# Patient Record
Sex: Female | Born: 1985 | Race: Black or African American | Hispanic: Yes | Smoking: Never smoker
Health system: Southern US, Community
[De-identification: ages and names within clinical notes are randomized; demographics above are authoritative.]

## PROBLEM LIST (undated history)

## (undated) DIAGNOSIS — R51 Headache: Secondary | ICD-10-CM

## (undated) DIAGNOSIS — R519 Headache, unspecified: Secondary | ICD-10-CM

## (undated) DIAGNOSIS — R001 Bradycardia, unspecified: Secondary | ICD-10-CM

## (undated) DIAGNOSIS — M797 Fibromyalgia: Secondary | ICD-10-CM

## (undated) DIAGNOSIS — E559 Vitamin D deficiency, unspecified: Secondary | ICD-10-CM

## (undated) DIAGNOSIS — M3500 Sicca syndrome, unspecified: Secondary | ICD-10-CM

## (undated) HISTORY — DX: Sjogren syndrome, unspecified: M35.00

## (undated) HISTORY — PX: TUBAL LIGATION: SHX77

## (undated) HISTORY — PX: NECK SURGERY: SHX720

---
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
## 2004-04-05 ENCOUNTER — Inpatient Hospital Stay (HOSPITAL_COMMUNITY): Admission: AD | Admit: 2004-04-05 | Discharge: 2004-04-05 | Payer: Self-pay | Admitting: Obstetrics

## 2004-04-07 ENCOUNTER — Observation Stay (HOSPITAL_COMMUNITY): Admission: AD | Admit: 2004-04-07 | Discharge: 2004-04-07 | Payer: Self-pay | Admitting: Obstetrics & Gynecology

## 2004-04-08 ENCOUNTER — Inpatient Hospital Stay (HOSPITAL_COMMUNITY): Admission: AD | Admit: 2004-04-08 | Discharge: 2004-04-10 | Payer: Self-pay | Admitting: Obstetrics & Gynecology

## 2004-05-02 ENCOUNTER — Emergency Department (HOSPITAL_COMMUNITY): Admission: EM | Admit: 2004-05-02 | Discharge: 2004-05-02 | Payer: Self-pay | Admitting: Emergency Medicine

## 2004-09-15 ENCOUNTER — Ambulatory Visit (HOSPITAL_COMMUNITY): Admission: RE | Admit: 2004-09-15 | Discharge: 2004-09-15 | Payer: Self-pay | Admitting: Obstetrics & Gynecology

## 2004-09-21 ENCOUNTER — Inpatient Hospital Stay (HOSPITAL_COMMUNITY): Admission: AD | Admit: 2004-09-21 | Discharge: 2004-09-22 | Payer: Self-pay | Admitting: Obstetrics

## 2004-10-03 ENCOUNTER — Ambulatory Visit (HOSPITAL_COMMUNITY): Admission: RE | Admit: 2004-10-03 | Discharge: 2004-10-03 | Payer: Self-pay | Admitting: Obstetrics & Gynecology

## 2007-09-27 ENCOUNTER — Emergency Department (HOSPITAL_COMMUNITY): Admission: EM | Admit: 2007-09-27 | Discharge: 2007-09-27 | Payer: Self-pay | Admitting: Emergency Medicine

## 2007-12-14 ENCOUNTER — Inpatient Hospital Stay (HOSPITAL_COMMUNITY): Admission: AD | Admit: 2007-12-14 | Discharge: 2007-12-14 | Payer: Self-pay | Admitting: Gynecology

## 2007-12-18 ENCOUNTER — Inpatient Hospital Stay (HOSPITAL_COMMUNITY): Admission: AD | Admit: 2007-12-18 | Discharge: 2007-12-18 | Payer: Self-pay | Admitting: Family Medicine

## 2008-10-24 ENCOUNTER — Ambulatory Visit (HOSPITAL_COMMUNITY): Admission: RE | Admit: 2008-10-24 | Discharge: 2008-10-24 | Payer: Self-pay | Admitting: Obstetrics and Gynecology

## 2008-11-20 ENCOUNTER — Ambulatory Visit (HOSPITAL_COMMUNITY): Admission: RE | Admit: 2008-11-20 | Discharge: 2008-11-20 | Payer: Self-pay | Admitting: Obstetrics and Gynecology

## 2008-12-11 ENCOUNTER — Ambulatory Visit (HOSPITAL_COMMUNITY): Admission: RE | Admit: 2008-12-11 | Discharge: 2008-12-11 | Payer: Self-pay | Admitting: Obstetrics and Gynecology

## 2008-12-23 ENCOUNTER — Inpatient Hospital Stay (HOSPITAL_COMMUNITY): Admission: AD | Admit: 2008-12-23 | Discharge: 2008-12-23 | Payer: Self-pay | Admitting: Obstetrics and Gynecology

## 2008-12-23 ENCOUNTER — Ambulatory Visit: Payer: Self-pay | Admitting: Advanced Practice Midwife

## 2009-05-06 ENCOUNTER — Inpatient Hospital Stay (HOSPITAL_COMMUNITY): Admission: AD | Admit: 2009-05-06 | Discharge: 2009-05-09 | Payer: Self-pay | Admitting: Obstetrics and Gynecology

## 2009-05-06 ENCOUNTER — Inpatient Hospital Stay (HOSPITAL_COMMUNITY): Admission: AD | Admit: 2009-05-06 | Discharge: 2009-05-06 | Payer: Self-pay | Admitting: Obstetrics & Gynecology

## 2009-05-13 ENCOUNTER — Inpatient Hospital Stay (HOSPITAL_COMMUNITY): Admission: AD | Admit: 2009-05-13 | Discharge: 2009-05-13 | Payer: Self-pay | Admitting: Obstetrics and Gynecology

## 2009-09-30 ENCOUNTER — Emergency Department (HOSPITAL_COMMUNITY): Admission: EM | Admit: 2009-09-30 | Discharge: 2009-09-30 | Payer: Self-pay | Admitting: Emergency Medicine

## 2010-04-11 ENCOUNTER — Ambulatory Visit (HOSPITAL_COMMUNITY)
Admission: RE | Admit: 2010-04-11 | Discharge: 2010-04-11 | Payer: Self-pay | Source: Home / Self Care | Attending: Obstetrics and Gynecology | Admitting: Obstetrics and Gynecology

## 2010-04-14 LAB — PREGNANCY, URINE: Preg Test, Ur: NEGATIVE

## 2010-06-15 LAB — URINALYSIS, ROUTINE W REFLEX MICROSCOPIC
Bilirubin Urine: NEGATIVE
Glucose, UA: NEGATIVE mg/dL
Hgb urine dipstick: NEGATIVE
Ketones, ur: NEGATIVE mg/dL
Nitrite: NEGATIVE
Protein, ur: NEGATIVE mg/dL
Specific Gravity, Urine: 1.029 (ref 1.005–1.030)
Urobilinogen, UA: 0.2 mg/dL (ref 0.0–1.0)
pH: 5.5 (ref 5.0–8.0)

## 2010-06-15 LAB — POCT PREGNANCY, URINE: Preg Test, Ur: NEGATIVE

## 2010-06-18 LAB — URINALYSIS, ROUTINE W REFLEX MICROSCOPIC
Bilirubin Urine: NEGATIVE
Glucose, UA: NEGATIVE mg/dL
Ketones, ur: NEGATIVE mg/dL
Nitrite: NEGATIVE
Protein, ur: 30 mg/dL — AB
Specific Gravity, Urine: 1.02 (ref 1.005–1.030)
Urobilinogen, UA: 0.2 mg/dL (ref 0.0–1.0)
pH: 6 (ref 5.0–8.0)

## 2010-06-18 LAB — CBC
HCT: 28.6 % — ABNORMAL LOW (ref 36.0–46.0)
HCT: 35.2 % — ABNORMAL LOW (ref 36.0–46.0)
Hemoglobin: 11.2 g/dL — ABNORMAL LOW (ref 12.0–15.0)
Hemoglobin: 9.3 g/dL — ABNORMAL LOW (ref 12.0–15.0)
MCHC: 31.9 g/dL (ref 30.0–36.0)
MCHC: 32.5 g/dL (ref 30.0–36.0)
MCV: 73.1 fL — ABNORMAL LOW (ref 78.0–100.0)
MCV: 74.1 fL — ABNORMAL LOW (ref 78.0–100.0)
Platelets: 177 10*3/uL (ref 150–400)
Platelets: 215 10*3/uL (ref 150–400)
RBC: 3.86 MIL/uL — ABNORMAL LOW (ref 3.87–5.11)
RBC: 4.81 MIL/uL (ref 3.87–5.11)
RDW: 17 % — ABNORMAL HIGH (ref 11.5–15.5)
RDW: 17.2 % — ABNORMAL HIGH (ref 11.5–15.5)
WBC: 10.5 10*3/uL (ref 4.0–10.5)
WBC: 20.2 10*3/uL — ABNORMAL HIGH (ref 4.0–10.5)

## 2010-06-18 LAB — URINE CULTURE: Colony Count: >=100000

## 2010-06-18 LAB — URINE MICROSCOPIC-ADD ON

## 2010-06-18 LAB — RPR: RPR Ser Ql: NONREACTIVE

## 2010-07-04 LAB — URINALYSIS, ROUTINE W REFLEX MICROSCOPIC
Bilirubin Urine: NEGATIVE
Glucose, UA: NEGATIVE mg/dL
Hgb urine dipstick: NEGATIVE
Ketones, ur: 40 mg/dL — AB
Nitrite: NEGATIVE
Protein, ur: NEGATIVE mg/dL
Specific Gravity, Urine: 1.02 (ref 1.005–1.030)
Urobilinogen, UA: 0.2 mg/dL (ref 0.0–1.0)
pH: 6.5 (ref 5.0–8.0)

## 2010-12-25 LAB — WET PREP, GENITAL
Trich, Wet Prep: NONE SEEN
WBC, Wet Prep HPF POC: NONE SEEN
Yeast Wet Prep HPF POC: NONE SEEN

## 2010-12-25 LAB — CBC
HCT: 40.2
Hemoglobin: 13.5
MCHC: 33.5
MCV: 75.7 — ABNORMAL LOW
Platelets: 275
RBC: 5.31 — ABNORMAL HIGH
RDW: 14.7
WBC: 15.3 — ABNORMAL HIGH

## 2010-12-25 LAB — DIFFERENTIAL
Basophils Relative: 0
Eosinophils Absolute: 1.5 — ABNORMAL HIGH
Eosinophils Relative: 10 — ABNORMAL HIGH
Lymphs Abs: 2.2
Monocytes Relative: 6

## 2010-12-25 LAB — PREGNANCY, URINE: Preg Test, Ur: NEGATIVE

## 2010-12-29 LAB — CBC
HCT: 37.3
Hemoglobin: 12.3
MCHC: 33
MCV: 78.2
Platelets: 283
RBC: 4.78
WBC: 10.4

## 2010-12-29 LAB — GC/CHLAMYDIA PROBE AMP, GENITAL: GC Probe Amp, Genital: NEGATIVE

## 2010-12-29 LAB — HCG, QUANTITATIVE, PREGNANCY: hCG, Beta Chain, Quant, S: 2833 — ABNORMAL HIGH

## 2010-12-29 LAB — HCG, SERUM, QUALITATIVE: Preg, Serum: POSITIVE — AB

## 2011-04-08 ENCOUNTER — Encounter (HOSPITAL_COMMUNITY): Payer: Self-pay | Admitting: Cardiology

## 2011-04-08 ENCOUNTER — Emergency Department (INDEPENDENT_AMBULATORY_CARE_PROVIDER_SITE_OTHER)
Admission: EM | Admit: 2011-04-08 | Discharge: 2011-04-08 | Disposition: A | Discharge status: Home / Self Care | Payer: BC Managed Care – PPO | Source: Home / Self Care | Attending: Emergency Medicine | Admitting: Emergency Medicine

## 2011-04-08 DIAGNOSIS — G43909 Migraine, unspecified, not intractable, without status migrainosus: Secondary | ICD-10-CM

## 2011-04-08 DIAGNOSIS — F410 Panic disorder [episodic paroxysmal anxiety] without agoraphobia: Secondary | ICD-10-CM

## 2011-04-08 DIAGNOSIS — G44209 Tension-type headache, unspecified, not intractable: Secondary | ICD-10-CM

## 2011-04-08 MED ORDER — KETOROLAC TROMETHAMINE 60 MG/2ML IM SOLN
60.0000 mg | Freq: Once | INTRAMUSCULAR | Status: AC
Start: 2011-04-08 — End: 2011-04-08
  Administered 2011-04-08: 60 mg via INTRAMUSCULAR

## 2011-04-08 MED ORDER — KETOROLAC TROMETHAMINE 60 MG/2ML IM SOLN
INTRAMUSCULAR | Status: AC
  Filled 2011-04-08: qty 2

## 2011-04-08 MED ORDER — KETOROLAC TROMETHAMINE 10 MG PO TABS: 10.0000 mg | ORAL_TABLET | Freq: Four times a day (QID) | ORAL | Status: AC | PRN

## 2011-04-08 MED ORDER — LORAZEPAM 1 MG PO TABS: 1.0000 mg | ORAL_TABLET | Freq: Three times a day (TID) | ORAL | Status: AC | PRN

## 2011-04-08 NOTE — ED Notes (Signed)
Pt started having a headache and dizziness at approx 1 pm today.  Pt also reports panic attack, fingers numb feeling like going to pass out bout 3pm. Told by nurse at job she needed to see a doctor. Denies N/V. Denies head injury.

## 2011-04-08 NOTE — ED Notes (Signed)
Pt reports increased stress at job. Headache to forehead more on right side. Steady type pain that is some times throbbing. No sensitivity to light or sound. NO double vision or blurriness.

## 2011-04-08 NOTE — ED Provider Notes (Signed)
History     CSN: [id number]  Arrival date & time 04/08/11  5621   First MD Initiated Contact with Patient 04/08/11 1830      Chief Complaint  Patient presents with  . Headache  . Dizziness  . Panic Attack    (Consider location/radiation/quality/duration/timing/severity/associated sxs/prior treatment) HPI Comments: Julia Fields has had a steady right-sided headache and stay at 1 PM. The headache came on after she a verbal altercation with her husband. She's also under stress on her job. The pain is localized to the right temporal and parietal area, and is usually steady but was sometimes throbbing. She denies any nausea or vomiting. No photophobia or phonophobia. She has had headaches like this before and has had more severe headaches which she classifies as migraine type headaches. She denied any aura, blurred vision, diplopia, or other visual symptoms. She felt somewhat dizzy and her fingers and toes felt numb and tingly. She felt like she was about to pass out. Around 3 PM she had what seemed to be a panic attack. She felt nervous, anxious, panicky, tremulous, had a rapid heartbeat, chest tightness, and shortness of breath. She had a previous episode like this a few years ago. The panic attack subsided quickly and the headache is a little bit better but not completely gone right now. She states that she and her husband are having marital problems and are about to separate. She states this is due to her husband's infidelity.  Patient is a 26 y.o. female presenting with headaches.  Headache The primary symptoms include headaches. Primary symptoms do not include dizziness or fever.  The headache is not associated with photophobia, eye pain, neck stiffness or weakness.  Additional symptoms do not include neck stiffness, weakness, photophobia or dysphoric mood.    History reviewed. No pertinent past medical history.  Past Surgical History  Procedure Date  . Neck surgery     cyst removed.     History reviewed. No pertinent family history.  History  Substance Use Topics  . Smoking status: Not on file  . Smokeless tobacco: Not on file  . Alcohol Use:     OB History    Grav Para Term Preterm Abortions TAB SAB Ect Mult Living                  Review of Systems  Constitutional: Negative for fever and chills.  HENT: Negative for ear pain, congestion, sore throat, rhinorrhea, neck pain, neck stiffness and sinus pressure.   Eyes: Negative for photophobia, pain, redness and visual disturbance.  Neurological: Positive for headaches. Negative for dizziness, facial asymmetry, speech difficulty, weakness, light-headedness and numbness.  Psychiatric/Behavioral: Negative for dysphoric mood. The patient is nervous/anxious.     Allergies  Review of patient's allergies indicates no known allergies.  Home Medications   Current Outpatient Rx  Name Route Sig Dispense Refill  . KETOROLAC TROMETHAMINE 10 MG PO TABS Oral Take 1 tablet (10 mg total) by mouth every 6 (six) hours as needed for pain. 20 tablet 0  . LORAZEPAM 1 MG PO TABS Oral Take 1 tablet (1 mg total) by mouth every 8 (eight) hours as needed for anxiety. 15 tablet 0    BP 129/77  Pulse 92  Temp(Src) 98.6 F (37 C) (Oral)  Resp 18  SpO2 99%  LMP 03/25/2011  Physical Exam  Nursing note and vitals reviewed. Constitutional: She is oriented to person, place, and time. She appears well-developed and well-nourished. No distress.  HENT:  Head:  Normocephalic and atraumatic.  Right Ear: External ear normal.  Left Ear: External ear normal.  Nose: Nose normal.  Mouth/Throat: Oropharynx is clear and moist.  Eyes: Conjunctivae and EOM are normal. Pupils are equal, round, and reactive to light.  Fundoscopic exam:      The right eye shows no exudate, no hemorrhage and no papilledema. The right eye shows venous pulsations.      The left eye shows no exudate, no hemorrhage and no papilledema. The left eye shows venous  pulsations. Neck: Normal range of motion. Neck supple.  Lymphadenopathy:    She has no cervical adenopathy.  Neurological: She is alert and oriented to person, place, and time. She has normal strength and normal reflexes. She displays no tremor. No cranial nerve deficit or sensory deficit. She exhibits normal muscle tone. Coordination and gait normal.  Skin: Skin is warm and dry. No rash noted. She is not diaphoretic.  Psychiatric: She has a normal mood and affect. Her behavior is normal.    ED Course  Procedures (including critical care time)  Labs Reviewed - No data to display No results found.   1. Tension type headache   2. Migraine headache   3. Panic attack       MDM          Roque Lias, MD 04/08/11 2156

## 2011-07-08 IMAGING — US US OB NUCHAL TRANSLUCENCY 1ST GEST
1 series · 14 of 21 positions shown · non-contrast
Comparison: none

OBSTETRICAL ULTRASOUND:
 This ultrasound was performed in The [HOSPITAL], and the [facility] report will be stored to [REDACTED] PACS.

[Series 1: us ob nuchal translucency 1st gest · 14 of 21 slices shown]
[im 1/21]
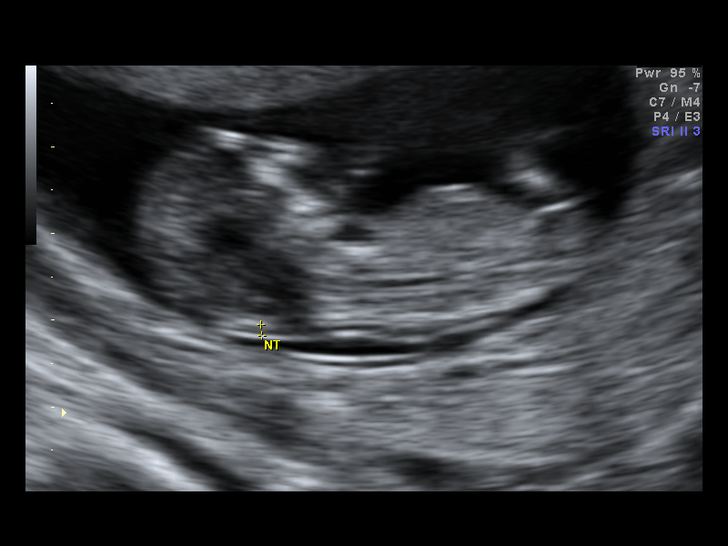
[im 3/21]
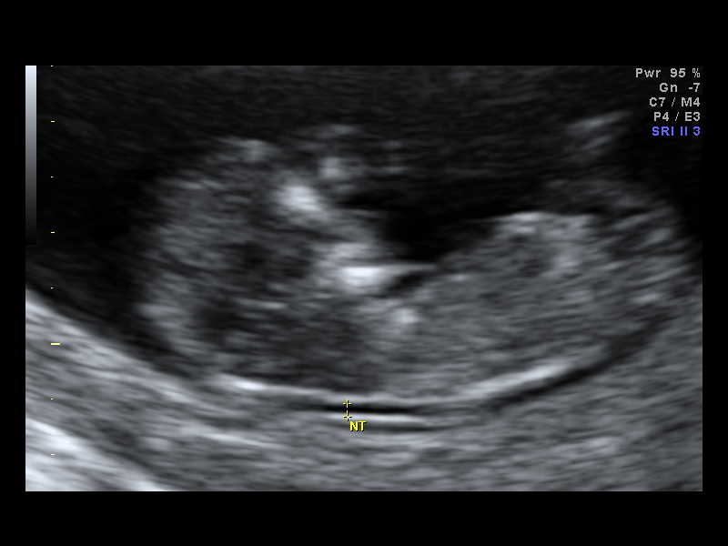
[im 4/21]
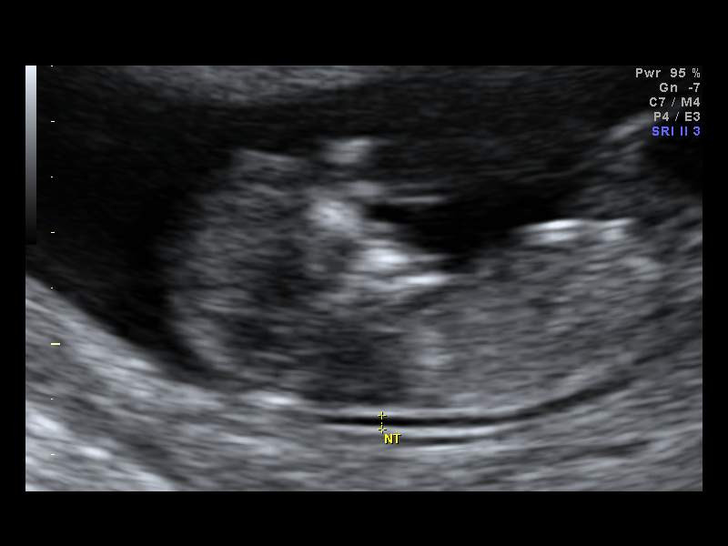
[im 6/21]
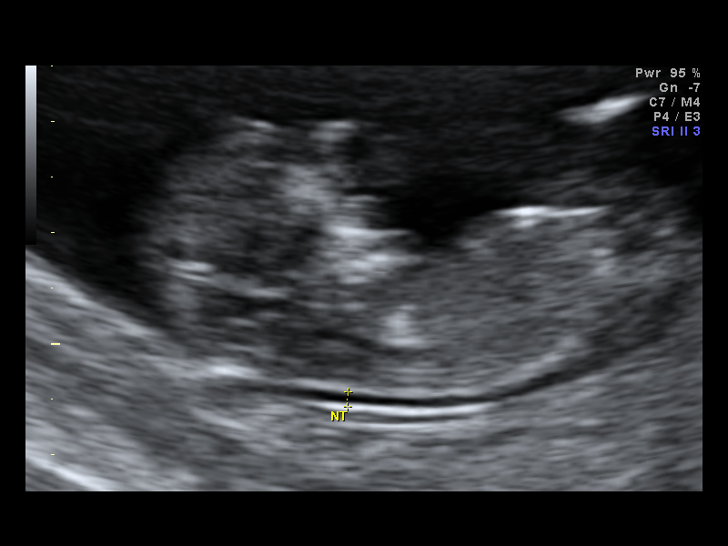
[im 7/21]
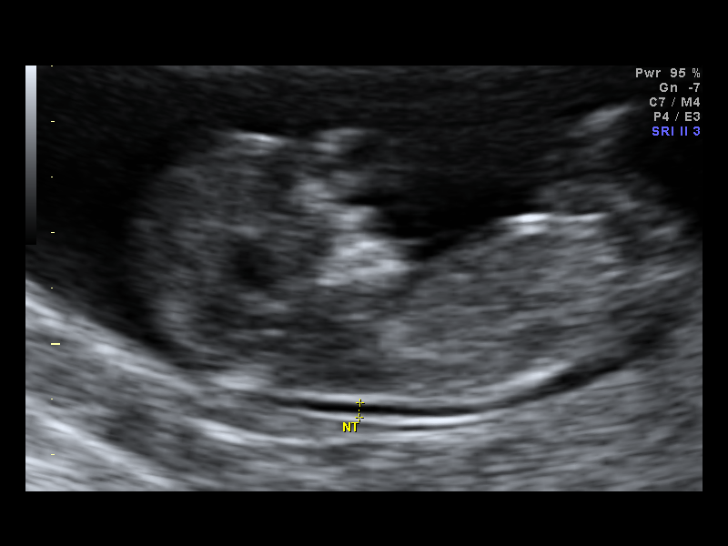
[im 9/21]
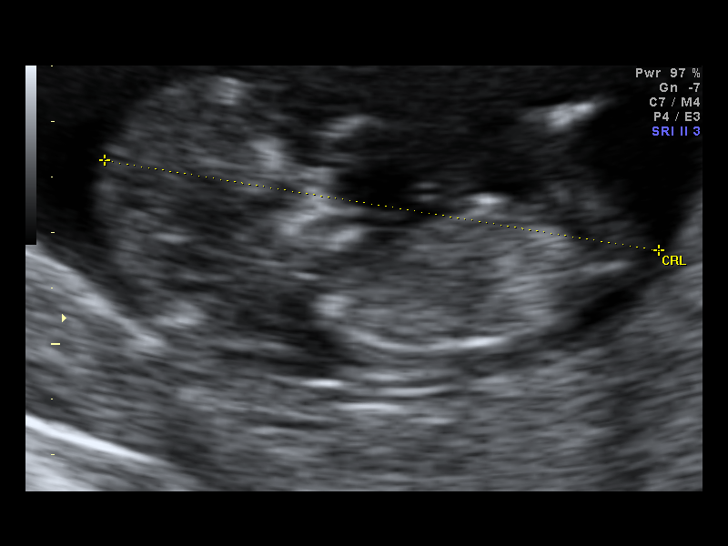
[im 10/21]
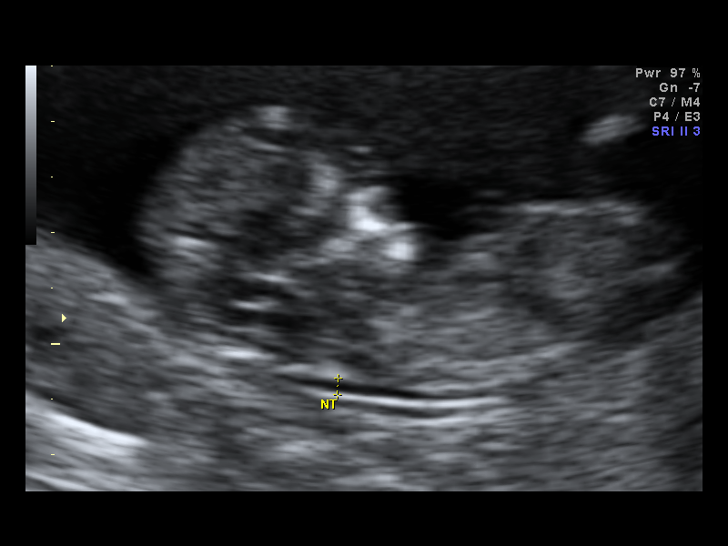
[im 12/21]
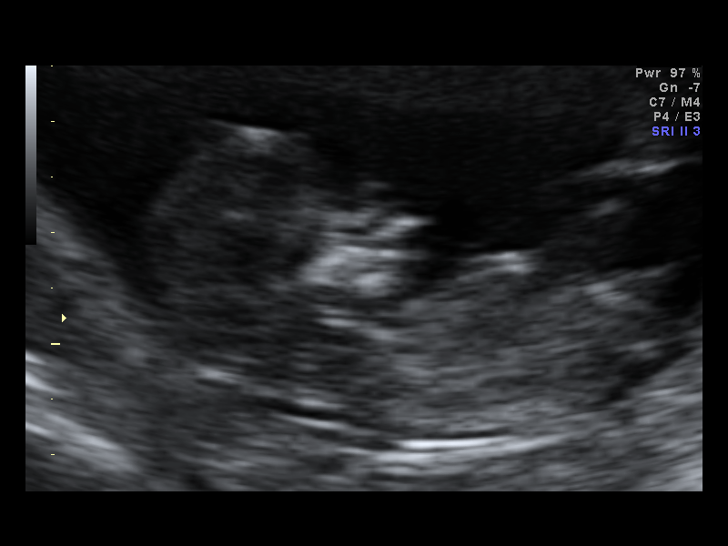
[im 13/21]
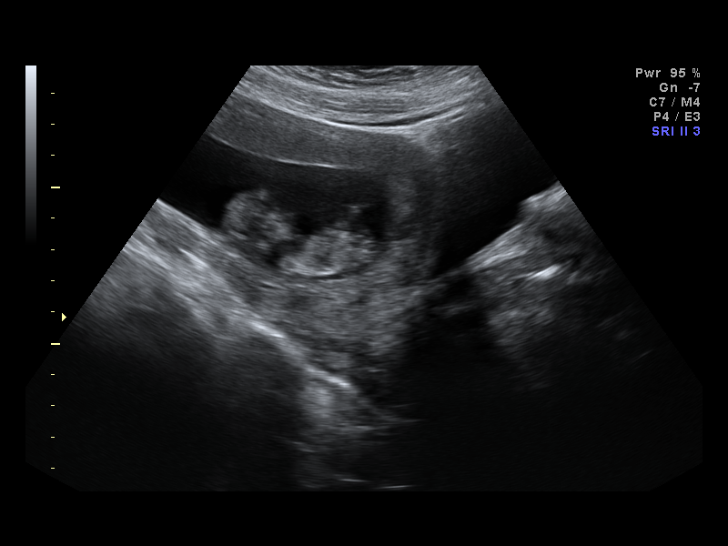
[im 15/21]
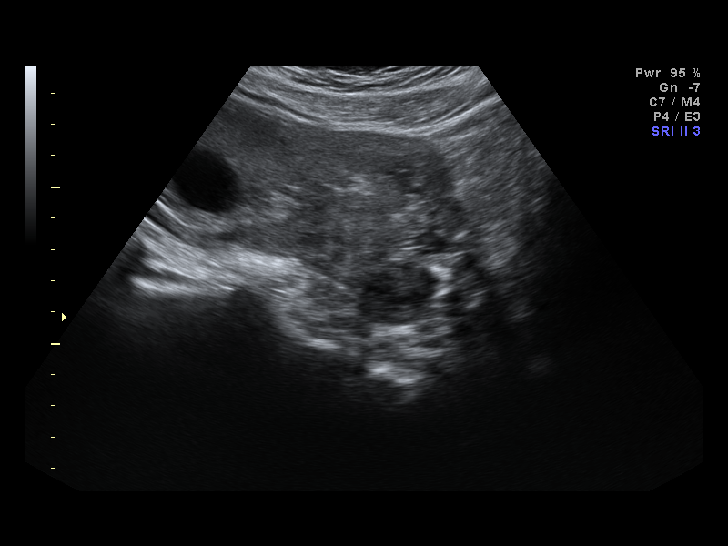
[im 16/21]
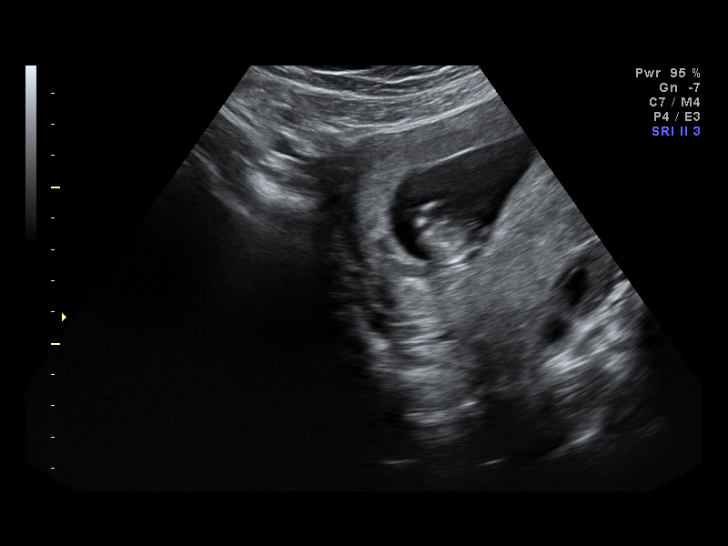
[im 18/21]
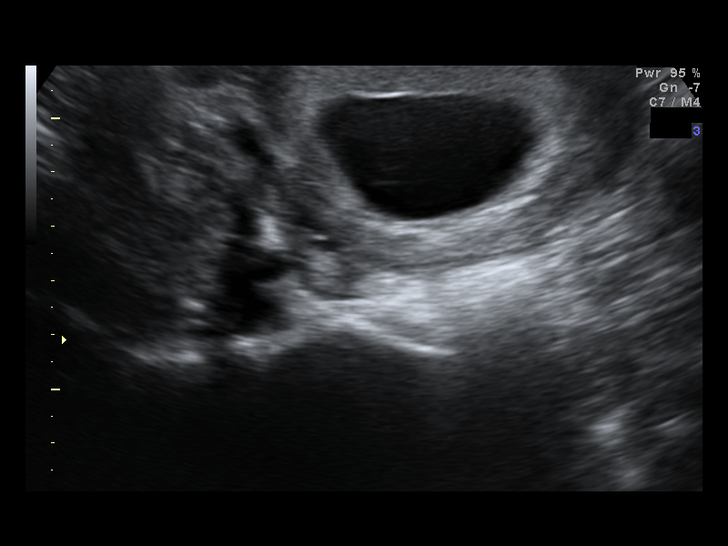
[im 19/21]
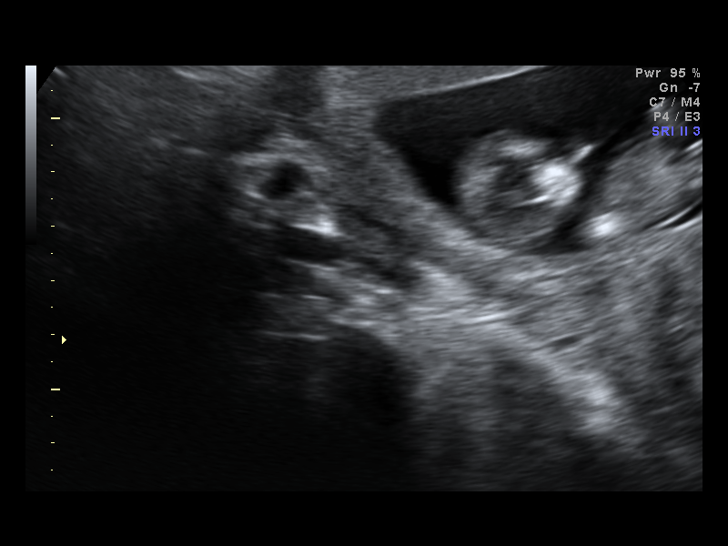
[im 21/21]
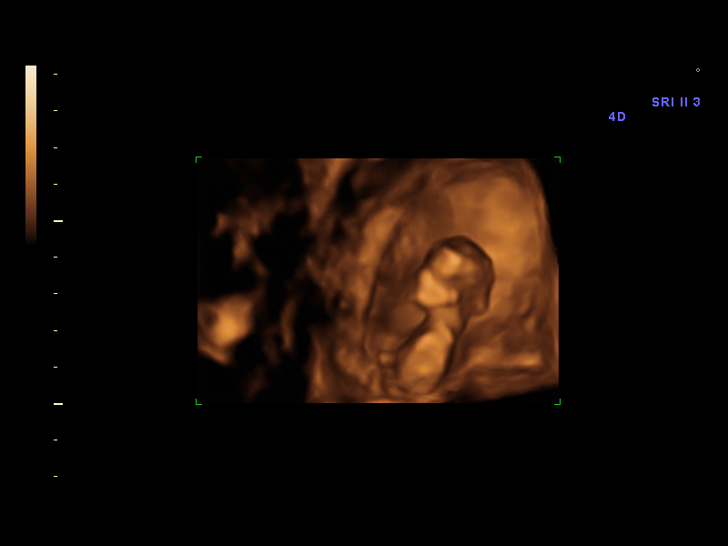

[14 of 21 positions shown; findings below may reference images not displayed]

IMPRESSION: [facility] has also been faxed to the ordering physician.

## 2011-07-17 ENCOUNTER — Emergency Department: Payer: Self-pay | Admitting: Emergency Medicine

## 2012-09-16 ENCOUNTER — Ambulatory Visit (INDEPENDENT_AMBULATORY_CARE_PROVIDER_SITE_OTHER): Payer: BC Managed Care – PPO | Admitting: Family Medicine

## 2012-09-16 VITALS — BP 117/80 | HR 84 | Temp 98.4°F | Resp 16 | Ht 61.0 in | Wt 213.8 lb

## 2012-09-16 DIAGNOSIS — N898 Other specified noninflammatory disorders of vagina: Secondary | ICD-10-CM

## 2012-09-16 LAB — GLUCOSE, POCT (MANUAL RESULT ENTRY): POC Glucose: 105 mg/dl — AB (ref 70–99)

## 2012-09-16 LAB — POCT WET PREP WITH KOH
RBC Wet Prep HPF POC: NEGATIVE
Yeast Wet Prep HPF POC: NEGATIVE

## 2012-09-16 MED ORDER — METRONIDAZOLE 500 MG PO TABS: 500.0000 mg | ORAL_TABLET | Freq: Two times a day (BID) | ORAL | Status: DC

## 2012-09-16 MED ORDER — FLUCONAZOLE 150 MG PO TABS: 150.0000 mg | ORAL_TABLET | Freq: Once | ORAL | Status: DC

## 2012-09-16 NOTE — Patient Instructions (Addendum)
Let me know if you are not better in the next few days- Sooner if worse.   You can try the boric acid vaginal suppositories to see if this might prevent your frequent BV

## 2012-09-16 NOTE — Progress Notes (Addendum)
[facility] [address] [place] [address] [phone]  Date:  09/16/2012   Name:  Julia Fields   DOB:  1986/03/02   MRN:  [id number]  PCP:  No primary provider on file.    Chief Complaint: Vaginal Discharge   History of Present Illness:  Julia Fields is a 27 y.o. very pleasant female patient who presents with the following:  She thinks she may have BV- last had this a few months ago.  She notes she has gotten BV a few times a year over the last several years.  She is SA with her husband only.    She has noted sx for about 10 days- odor, some itching, some discharge, she has noted some mild vaginal discomfort.    No urinary sx, no back pain, fever or vomiting,    She is otherwise generally healhty .   LMP was earlier this month- she had essure in 2011.  LMP was earlier this month.     There are no active problems to display for this patient.   History reviewed. No pertinent past medical history.  Past Surgical History  Procedure Laterality Date  . Neck surgery      cyst removed.    History  Substance Use Topics  . Smoking status: Never Smoker   . Smokeless tobacco: Not on file  . Alcohol Use: No    History reviewed. No pertinent family history.  No Known Allergies  Medication list has been reviewed and updated.  No current outpatient prescriptions on file prior to visit.   No current facility-administered medications on file prior to visit.    Review of Systems:  As per HPI- otherwise negative.   Physical Examination: Filed Vitals:   09/16/12 1817  BP: 117/80  Pulse: 84  Temp: 98.4 F (36.9 C)  Resp: 16   Filed Vitals:   09/16/12 1817  Height: 5\' 1"  (1.549 m)  Weight: 213 lb 12.8 oz (96.979 kg)   Body mass index is 40.42 kg/(m^2). Ideal Body Weight: Weight in (lb) to have BMI = 25: 132  GEN: WDWN, NAD, Non-toxic, A & O x 3, obese HEENT: Atraumatic, Normocephalic. Neck supple. No masses, No LAD. Ears and  Nose: No external deformity. CV: RRR, No M/G/R. No JVD. No thrill. No extra heart sounds. PULM: CTA B, no wheezes, crackles, rhonchi. No retractions. No resp. distress. No accessory muscle use. ABD: S, NT, ND. No rebound. No HSM. EXTR: No c/c/e NEURO Normal gait.  PSYCH: Normally interactive. Conversant. Not depressed or anxious appearing.  Calm demeanor.  GU: thick white discharge, mild inflammation of vaginal canal.  No CMT or discharge from cervix. No adnexal tenderness or masses   Results for orders placed in visit on 09/16/12  GLUCOSE, POCT (MANUAL RESULT ENTRY)      Result Value Range   POC Glucose 105 (*) 70 - 99 mg/dl  POCT WET PREP WITH KOH      Result Value Range   Trichomonas, UA Negative     Clue Cells Wet Prep HPF POC 3-5     Epithelial Wet Prep HPF POC 6-10     Yeast Wet Prep HPF POC neg     Bacteria Wet Prep HPF POC 3+     RBC Wet Prep HPF POC neg     WBC Wet Prep HPF POC 4-8     KOH Prep POC Negative      Assessment and Plan: Vaginal discharge - Plan:  POCT glucose (manual entry), POCT Wet Prep with KOH, GC/Chlamydia Probe Amp, fluconazole (DIFLUCAN) 150 MG tablet, metroNIDAZOLE (FLAGYL) 500 MG tablet  Treat for recurrent BV and suspected yeast vaginitis as above.  Also gave rx for boric acid suppositories to use for BV prophylaxis if desired- she will give this a try. Call or RTC if any other concerns or if not better  Signed Abbe Amsterdam, MD  6/22- called and LMOM.  I received her other labs- negative (did not leave specifics).  Will mail a copy to her.

## 2012-09-18 ENCOUNTER — Encounter: Payer: Self-pay | Admitting: Family Medicine

## 2012-10-30 ENCOUNTER — Ambulatory Visit (INDEPENDENT_AMBULATORY_CARE_PROVIDER_SITE_OTHER): Payer: BC Managed Care – PPO | Admitting: Family Medicine

## 2012-10-30 VITALS — BP 112/70 | HR 80 | Temp 98.0°F | Resp 16 | Ht 61.0 in | Wt 218.0 lb

## 2012-10-30 DIAGNOSIS — N898 Other specified noninflammatory disorders of vagina: Secondary | ICD-10-CM

## 2012-10-30 DIAGNOSIS — B3731 Acute candidiasis of vulva and vagina: Secondary | ICD-10-CM

## 2012-10-30 DIAGNOSIS — N760 Acute vaginitis: Secondary | ICD-10-CM

## 2012-10-30 DIAGNOSIS — B373 Candidiasis of vulva and vagina: Secondary | ICD-10-CM

## 2012-10-30 DIAGNOSIS — R21 Rash and other nonspecific skin eruption: Secondary | ICD-10-CM

## 2012-10-30 DIAGNOSIS — B9689 Other specified bacterial agents as the cause of diseases classified elsewhere: Secondary | ICD-10-CM

## 2012-10-30 LAB — POCT WET PREP WITH KOH
KOH Prep POC: POSITIVE
Trichomonas, UA: NEGATIVE
Yeast Wet Prep HPF POC: NEGATIVE

## 2012-10-30 MED ORDER — METRONIDAZOLE 500 MG PO TABS: 500.0000 mg | ORAL_TABLET | Freq: Two times a day (BID) | ORAL | Status: DC

## 2012-10-30 MED ORDER — AMBULATORY NON FORMULARY MEDICATION: Status: DC

## 2012-10-30 MED ORDER — FLUCONAZOLE 150 MG PO TABS: 150.0000 mg | ORAL_TABLET | Freq: Once | ORAL | Status: DC

## 2012-10-30 NOTE — Patient Instructions (Addendum)
1.  Call if rash still present after using [facility] ointment for two weeks.

## 2012-10-30 NOTE — Progress Notes (Signed)
[address]   [place], [place]  [address]   [phone]  Subjective:    Patient ID: Julia Fields, female    DOB: 1985-08-25, 27 y.o.   MRN: [id number]  HPI This 27 y.o. female presents for evaluation of recurrent vaginal discharge.  Evaluated last month; diagnosed with BV; treated with Metronidazole; also prescribed boric acid suppositories and treated for yeast vaginitis.  Onset of vaginal discharge 10/02/12.  Usually get symptoms after monthly/menses.  +vaginal discharge yellowish.  +odor.  Recently started itching after menses.  No vaginal irritation.  Married; no new partners; separated; +dating; not sexually active.  Lost boric acid suppository rx last month.  Had essure procedure for contraception.  Onset of BV occurred after procedure.  2.  L chest/breats area/spot: mother noticed spot on chest; onset two weeks ago.  No itching.     Review of Systems  Constitutional: Negative for fever, chills, diaphoresis and fatigue.  Gastrointestinal: Negative for nausea, vomiting and abdominal pain.  Genitourinary: Positive for vaginal discharge. Negative for dysuria, urgency, frequency, hematuria, vaginal bleeding, genital sores, vaginal pain, menstrual problem and pelvic pain.  Skin: Positive for color change and rash. Negative for pallor and wound.    History reviewed. No pertinent past medical history.  Past Surgical History  Procedure Laterality Date  . Neck surgery      cyst removed.    Prior to Admission medications   Medication Sig Start Date End Date Taking? Authorizing Provider  fluconazole (DIFLUCAN) 150 MG tablet Take 1 tablet (150 mg total) by mouth once. 09/16/12   Gwenlyn Found Copland, MD  metroNIDAZOLE (FLAGYL) 500 MG tablet Take 1 tablet (500 mg total) by mouth 2 (two) times daily. Take 1 pill twice daily for one week. NO alcohol 09/16/12   Pearline Cables, MD    No Known Allergies  History   Social History  . Marital Status: Married    Spouse Name: N/A    Number of  Children: N/A  . Years of Education: N/A   Occupational History  . Not on file.   Social History Main Topics  . Smoking status: Never Smoker   . Smokeless tobacco: Not on file  . Alcohol Use: No  . Drug Use: No  . Sexually Active: Not on file   Other Topics Concern  . Not on file   Social History Narrative  . No narrative on file    History reviewed. No pertinent family history.     Objective:   Physical Exam  Nursing note and vitals reviewed. Constitutional: She appears well-developed and well-nourished. No distress.  Abdominal: Soft. Bowel sounds are normal. She exhibits no distension. There is no tenderness. There is no rebound and no guarding.  Genitourinary: Vagina normal and uterus normal. There is no rash, tenderness or lesion on the right labia. There is no rash, tenderness or lesion on the left labia. Cervix exhibits no motion tenderness, no discharge and no friability. Right adnexum displays no mass, no tenderness and no fullness. Left adnexum displays no mass, no tenderness and no fullness.  Skin: Rash noted. She is not diaphoretic.  L upper breast with annular rash with slight scaling and defined border.  Hypopigmentation centrally. Mid abdomen with smaller annular rash with scant scaling and central hypopigmentation.   Results for orders placed in visit on 10/30/12  POCT WET PREP WITH KOH      Result Value Range   Trichomonas, UA Negative     Clue Cells Wet Prep  HPF POC 3-7     Epithelial Wet Prep HPF POC 3-9     Yeast Wet Prep HPF POC negative     Bacteria Wet Prep HPF POC 3+     RBC Wet Prep HPF POC 0-1     WBC Wet Prep HPF POC 1-3     KOH Prep POC Positive    POCT SKIN KOH      Result Value Range   Skin KOH, POC Negative         Assessment & Plan:  Rash and nonspecific skin eruption - Plan: POCT Skin KOH  Vaginal discharge - Plan: POCT Wet Prep with KOH  BV (bacterial vaginosis) - Plan: metroNIDAZOLE (FLAGYL) 500 MG tablet, AMBULATORY NON  FORMULARY MEDICATION  Candidiasis of vagina - Plan: fluconazole (DIFLUCAN) 150 MG tablet, AMBULATORY NON FORMULARY MEDICATION   1.  BV:  Recurrent; rx for Flagyl provided; rx for Boric Acid Suppositories also provided. 2.  Candidiasis vaginal:  Recurrent; rx for Diflucan provided; also rx for Boric Acid suppositories provided. 3.  Rash:  New. KOH negative; offered rx for Ketonazole offered but declined by patient; desires to try OTC cream first.  Will call if rash persists.  Meds ordered this encounter  Medications  . fluconazole (DIFLUCAN) 150 MG tablet    Sig: Take 1 tablet (150 mg total) by mouth once.    Dispense:  2 tablet    Refill:  0  . metroNIDAZOLE (FLAGYL) 500 MG tablet    Sig: Take 1 tablet (500 mg total) by mouth 2 (two) times daily. Take 1 pill twice daily for one week. NO alcohol    Dispense:  14 tablet    Refill:  0  . AMBULATORY NON FORMULARY MEDICATION    Sig: Boric Acid Suppository 600 mg Insert 1 PV twice weekly to maintain vaginal flora    Dispense:  10 suppository    Refill:  5

## 2012-12-18 ENCOUNTER — Encounter (HOSPITAL_COMMUNITY): Payer: Self-pay | Admitting: *Deleted

## 2012-12-18 ENCOUNTER — Emergency Department (HOSPITAL_COMMUNITY)
Admission: EM | Admit: 2012-12-18 | Discharge: 2012-12-19 | Disposition: A | Discharge status: Home / Self Care | Payer: BC Managed Care – PPO | Attending: Emergency Medicine | Admitting: Emergency Medicine

## 2012-12-18 DIAGNOSIS — R51 Headache: Secondary | ICD-10-CM | POA: Insufficient documentation

## 2012-12-18 MED ORDER — METOCLOPRAMIDE HCL 5 MG/ML IJ SOLN
10.0000 mg | Freq: Once | INTRAMUSCULAR | Status: AC
  Administered 2012-12-18: 10 mg via INTRAMUSCULAR
  Filled 2012-12-18: qty 2

## 2012-12-18 MED ORDER — HYDROCODONE-ACETAMINOPHEN 5-325 MG PO TABS
1.0000 | ORAL_TABLET | Freq: Once | ORAL | Status: AC
  Administered 2012-12-18: 1 via ORAL
  Filled 2012-12-18: qty 1

## 2012-12-18 MED ORDER — DIPHENHYDRAMINE HCL 25 MG PO CAPS
25.0000 mg | ORAL_CAPSULE | Freq: Once | ORAL | Status: AC
  Administered 2012-12-18: 25 mg via ORAL
  Filled 2012-12-18: qty 1

## 2012-12-18 NOTE — ED Notes (Signed)
The pt is c/o head pain since 0500am with some radiation down rt neck and she has had 2 nosebleeds  Today also.  Hx of nosebleeds.  None now

## 2012-12-18 NOTE — ED Provider Notes (Signed)
CSN: [id number]     Arrival date & time 12/18/12  1759 History   First MD Initiated Contact with Patient 12/18/12 2225     Chief Complaint  Patient presents with  . head pain    (Consider location/radiation/quality/duration/timing/severity/associated sxs/prior Treatment) HPI Comments: 27 yo female with no medical hx, no smoking or birth control use, no blood clot hx presents with right sided HA with mild radiation down right neck since this am.  Pt woke up with burning/ sharp pain  Constant with sharp intermittent pain. On right side of head/ face.  No other neuro sxs. No hx of similar.  Unknown onset acuity but gradually worsened today. No hx of similar ha.  Nothing improves.  Small nosebleed stopped.    The history is provided by the patient.    History reviewed. No pertinent past medical history. Past Surgical History  Procedure Laterality Date  . Neck surgery      cyst removed.   No family history on file. History  Substance Use Topics  . Smoking status: Never Smoker   . Smokeless tobacco: Not on file  . Alcohol Use: Yes   OB History   Grav Para Term Preterm Abortions TAB SAB Ect Mult Living                 Review of Systems  Constitutional: Negative for fever and chills.  HENT: Negative for neck pain and neck stiffness.   Eyes: Negative for visual disturbance.  Respiratory: Negative for shortness of breath.   Cardiovascular: Negative for chest pain.  Gastrointestinal: Negative for vomiting and abdominal pain.  Genitourinary: Negative for dysuria and flank pain.  Musculoskeletal: Negative for back pain.  Skin: Negative for rash.  Neurological: Positive for headaches. Negative for light-headedness.    Allergies  Review of patient's allergies indicates no known allergies.  Home Medications   Current Outpatient Rx  Name  Route  Sig  Dispense  Refill  . ibuprofen (ADVIL,MOTRIN) 200 MG tablet   Oral   Take 800 mg by mouth every 6 (six) hours as needed for pain.           BP 131/93  Pulse 73  Temp(Src) 98.5 F (36.9 C) (Oral)  Resp 18  SpO2 95% Physical Exam  Nursing note and vitals reviewed. Constitutional: She is oriented to person, place, and time. She appears well-developed and well-nourished.  HENT:  Head: Normocephalic and atraumatic.  Eyes: Conjunctivae are normal. Right eye exhibits no discharge. Left eye exhibits no discharge.  Neck: Normal range of motion. Neck supple. No tracheal deviation present.  Cardiovascular: Normal rate and regular rhythm.   Pulmonary/Chest: Effort normal and breath sounds normal.  Abdominal: Soft. She exhibits no distension. There is no tenderness. There is no guarding.  Musculoskeletal: She exhibits no edema.  Neurological: She is alert and oriented to person, place, and time.  5+ strength in UE and LE with f/e at major joints. Sensation to palpation intact in UE and LE. CNs 2-12 grossly intact.  EOMFI.  PERRL.   Finger nose and coordination intact bilateral.   Visual fields intact to finger testing.   Skin: Skin is warm. No rash noted.  Psychiatric: She has a normal mood and affect.    ED Course  Procedures (including critical care time) Labs Review Labs Reviewed - No data to display Imaging Review No results found.  MDM  No diagnosis found.  HA different than previous. Clinically concern for possible trigeminal neuralgia with numbness/ pain right  face and teeth. CT head since different than previous.  Pt well appearing. Normal neuro exam. No blood clot risks. Pain meds given.  Ct Head Wo Contrast  12/19/2012   CLINICAL DATA:  Headache extending into the right neck with nosebleeds today.  EXAM: CT HEAD WITHOUT CONTRAST  TECHNIQUE: Contiguous axial images were obtained from the base of the skull through the vertex without intravenous contrast.  COMPARISON:  None.  FINDINGS: There is no evidence of acute intracranial hemorrhage, mass lesion, brain edema or extra-axial fluid collection. The  ventricles and subarachnoid spaces are appropriately sized for age. There is no CT evidence of acute cortical infarction. .  The visualized paranasal sinuses, mastoid air cells and middle ears are clear. The calvarium is intact.  IMPRESSION: Normal noncontrast head CT.   Electronically Signed   By: Roxy Horseman   On: 12/19/2012 00:20    Enid Skeens, MD 12/19/12 0130

## 2012-12-19 ENCOUNTER — Emergency Department (HOSPITAL_COMMUNITY): Payer: BC Managed Care – PPO

## 2012-12-19 MED ORDER — IBUPROFEN 800 MG PO TABS
800.0000 mg | ORAL_TABLET | Freq: Once | ORAL | Status: AC
  Administered 2012-12-19: 800 mg via ORAL
  Filled 2012-12-19: qty 1

## 2012-12-19 NOTE — ED Notes (Signed)
Patient transported to CT 

## 2012-12-21 ENCOUNTER — Ambulatory Visit (INDEPENDENT_AMBULATORY_CARE_PROVIDER_SITE_OTHER): Payer: BC Managed Care – PPO | Admitting: Family Medicine

## 2012-12-21 DIAGNOSIS — J039 Acute tonsillitis, unspecified: Secondary | ICD-10-CM

## 2012-12-21 DIAGNOSIS — R109 Unspecified abdominal pain: Secondary | ICD-10-CM

## 2012-12-21 DIAGNOSIS — H6590 Unspecified nonsuppurative otitis media, unspecified ear: Secondary | ICD-10-CM

## 2012-12-21 LAB — POCT CBC
Granulocyte percent: 82.8 %G — AB (ref 37–80)
HCT, POC: 33.6 % — AB (ref 37.7–47.9)
MPV: 9.6 fL (ref 0–99.8)
POC Granulocyte: 11.3 — AB (ref 2–6.9)
POC LYMPH PERCENT: 13.4 %L (ref 10–50)
Platelet Count, POC: 215 10*3/uL (ref 142–424)
RDW, POC: 14.8 %

## 2012-12-21 LAB — POCT URINALYSIS DIPSTICK
Bilirubin, UA: NEGATIVE
Glucose, UA: NEGATIVE
Leukocytes, UA: NEGATIVE
Nitrite, UA: NEGATIVE
pH, UA: 5.5

## 2012-12-21 LAB — POCT UA - MICROSCOPIC ONLY
Casts, Ur, LPF, POC: NEGATIVE
Crystals, Ur, HPF, POC: NEGATIVE

## 2012-12-21 MED ORDER — AMOXICILLIN-POT CLAVULANATE 875-125 MG PO TABS: 1.0000 | ORAL_TABLET | Freq: Two times a day (BID) | ORAL | Status: DC

## 2012-12-21 MED ORDER — MUCINEX DM MAXIMUM STRENGTH 60-1200 MG PO TB12: 1.0000 | ORAL_TABLET | Freq: Two times a day (BID) | ORAL | Status: DC

## 2012-12-21 MED ORDER — PSEUDOEPHEDRINE HCL ER 120 MG PO TB12: 120.0000 mg | ORAL_TABLET | Freq: Two times a day (BID) | ORAL | Status: DC

## 2012-12-21 MED ORDER — HYDROCOD POLST-CHLORPHEN POLST 10-8 MG/5ML PO LQCR: 5.0000 mL | Freq: Two times a day (BID) | ORAL | Status: DC | PRN

## 2012-12-21 MED ORDER — IPRATROPIUM BROMIDE 0.03 % NA SOLN: 2.0000 | Freq: Four times a day (QID) | NASAL | Status: DC

## 2012-12-21 NOTE — Patient Instructions (Addendum)

## 2012-12-21 NOTE — Progress Notes (Signed)
Subjective:    Patient ID: Julia Fields, female    DOB: 02/23/1986, 27 y.o.   MRN: [id number] Chief Complaint  Patient presents with  . body aches, sore throat, fever, chills, ear pain, congestion    started sunday     HPI Developed illness 4d previously.  She went to the hospital with migraine vs inflammed nerve - pain along right side of head and neck and by the time her 9 hr ER visit ended she had developed sore throat.  The following morning the throat pain got worse, nasal congestion and whole body myalgias/arthralgias, weakness, neck pain, HA. Had developed fever 102.5. Last night she felt better finely but today much worse.  She has been taking mucinex at night and ibuprofen w/o relief.  Had fever this morning and feels chilled now.  Son also ill but seems to be getting better.  Has been trying to push fluids.  No n/v.  Urinary frequency and urgency.  Also cough productive of thick yellow phlegm.  Not sleeping well due to body pains and nasal congestion and throat pain.  History reviewed. No pertinent past medical history. Current Outpatient Prescriptions on File Prior to Visit  Medication Sig Dispense Refill  . ibuprofen (ADVIL,MOTRIN) 200 MG tablet Take 800 mg by mouth every 6 (six) hours as needed for pain.       No current facility-administered medications on file prior to visit.   No Known Allergies   Review of Systems  Constitutional: Positive for fever, chills, diaphoresis, appetite change and fatigue.  HENT: Positive for ear pain, congestion, sore throat, rhinorrhea, neck pain, neck stiffness and sinus pressure. Negative for nosebleeds, sneezing, drooling, mouth sores, trouble swallowing, voice change, postnasal drip and ear discharge.   Eyes: Negative for photophobia and pain.  Respiratory: Positive for cough. Negative for shortness of breath.   Cardiovascular: Negative for chest pain.  Gastrointestinal: Positive for abdominal pain. Negative for nausea, vomiting,  diarrhea, constipation and abdominal distention.  Genitourinary: Positive for urgency and frequency. Negative for dysuria.  Musculoskeletal: Positive for myalgias, back pain and arthralgias. Negative for joint swelling and gait problem.  Neurological: Positive for weakness, light-headedness and headaches. Negative for dizziness and syncope.  Hematological: Positive for adenopathy.  Psychiatric/Behavioral: Positive for sleep disturbance.      BP 120/72  Pulse 105  Temp(Src) 98.8 F (37.1 C) (Oral)  Resp 19  Ht 5\' 1"  (1.549 m)  Wt 220 lb (99.791 kg)  BMI 41.59 kg/m2  SpO2 100%  LMP 11/28/2012 Objective:   Physical Exam  Constitutional: She is oriented to person, place, and time. She appears well-developed and well-nourished. She appears lethargic. She appears ill. No distress.  Wearing mask, laying on bed, covered in blankets  HENT:  Head: Normocephalic and atraumatic.  Right Ear: External ear and ear canal normal. Tympanic membrane is injected and retracted. A middle ear effusion is present.  Left Ear: External ear and ear canal normal. Tympanic membrane is injected and retracted. A middle ear effusion is present.  Nose: Mucosal edema and rhinorrhea present. Right sinus exhibits maxillary sinus tenderness. Left sinus exhibits maxillary sinus tenderness.  Mouth/Throat: Uvula is midline and mucous membranes are normal. Oropharyngeal exudate, posterior oropharyngeal edema and posterior oropharyngeal erythema present. No tonsillar abscesses.  Eyes: Conjunctivae are normal. Right eye exhibits no discharge. Left eye exhibits no discharge. No scleral icterus.  Neck: Normal range of motion. Neck supple.  Cardiovascular: Normal rate, regular rhythm, normal heart sounds and intact distal pulses.   Pulmonary/Chest:  Effort normal and breath sounds normal.  Abdominal: Soft. Normal appearance and bowel sounds are normal. There is generalized tenderness. There is CVA tenderness. There is no rigidity,  no rebound and no guarding.  Lymphadenopathy:       Head (right side): Submandibular and tonsillar adenopathy present. No preauricular and no posterior auricular adenopathy present.       Head (left side): Submandibular and tonsillar adenopathy present. No preauricular and no posterior auricular adenopathy present.    She has cervical adenopathy.       Right cervical: Superficial cervical adenopathy present.       Left cervical: Superficial cervical adenopathy present.       Right: No supraclavicular adenopathy present.       Left: No supraclavicular adenopathy present.  Neurological: She is oriented to person, place, and time. She appears lethargic.  Skin: Skin is warm and dry. She is not diaphoretic. No erythema.  Psychiatric: She has a normal mood and affect. Her behavior is normal.       Results for orders placed in visit on 12/21/12  POCT CBC      Result Value Range   WBC 13.7 (*) 4.6 - 10.2 K/uL   Lymph, poc 1.8  0.6 - 3.4   POC LYMPH PERCENT 13.4  10 - 50 %L   MID (cbc) 0.5  0 - 0.9   POC MID % 3.8  0 - 12 %M   POC Granulocyte 11.3 (*) 2 - 6.9   Granulocyte percent 82.8 (*) 37 - 80 %G   RBC 4.17  4.04 - 5.48 M/uL   Hemoglobin 10.3 (*) 12.2 - 16.2 g/dL   HCT, POC 65.7 (*) 84.6 - 47.9 %   MCV 80.5  80 - 97 fL   MCH, POC 24.7 (*) 27 - 31.2 pg   MCHC 30.7 (*) 31.8 - 35.4 g/dL   RDW, POC 96.2     Platelet Count, POC 215  142 - 424 K/uL   MPV 9.6  0 - 99.8 fL  POCT INFLUENZA A/B      Result Value Range   Influenza A, POC Negative     Influenza B, POC Negative    POCT RAPID STREP A (OFFICE)      Result Value Range   Rapid Strep A Screen Negative  Negative  POCT UA - MICROSCOPIC ONLY      Result Value Range   WBC, Ur, HPF, POC 2-4     RBC, urine, microscopic 3-5     Bacteria, U Microscopic trace     Mucus, UA trace     Epithelial cells, urine per micros 3-6     Crystals, Ur, HPF, POC neg     Casts, Ur, LPF, POC neg     Yeast, UA neg    POCT URINALYSIS DIPSTICK       Result Value Range   Color, UA yellow     Clarity, UA hazy     Glucose, UA neg     Bilirubin, UA neg     Ketones, UA neg     Spec Grav, UA >=1.030     Blood, UA trace     pH, UA 5.5     Protein, UA neg     Urobilinogen, UA 0.2     Nitrite, UA neg     Leukocytes, UA Negative     Assessment & Plan:  Serous otitis media, bilateral - Plan: POCT Influenza A/B  Acute tonsillitis - Plan: POCT CBC,  POCT rapid strep A, Culture, Group A Strep, CANCELED: Culture, Group A Strep  Abdominal pain, unspecified site - Plan: Comprehensive metabolic panel, POCT UA - Microscopic Only, POCT urinalysis dipstick  Suspect virally mediated due to diffuse constillation of sxs but looks like ear infection developing and sig LAD as well as left shift on wbc so will go ahead and cover with augmentin.  RTC if worsening. Meds ordered this encounter  Medications  . amoxicillin-clavulanate (AUGMENTIN) 875-125 MG per tablet    Sig: Take 1 tablet by mouth 2 (two) times daily.    Dispense:  20 tablet    Refill:  0  . chlorpheniramine-HYDROcodone (TUSSIONEX PENNKINETIC ER) 10-8 MG/5ML LQCR    Sig: Take 5 mLs by mouth every 12 (twelve) hours as needed (cough).    Dispense:  140 mL    Refill:  0  . ipratropium (ATROVENT) 0.03 % nasal spray    Sig: Place 2 sprays into the nose 4 (four) times daily.    Dispense:  30 mL    Refill:  1  . Dextromethorphan-Guaifenesin (MUCINEX DM MAXIMUM STRENGTH) 60-1200 MG TB12    Sig: Take 1 tablet by mouth every 12 (twelve) hours.    Dispense:  30 each    Refill:  0  . pseudoephedrine (SUDAFED 12 HOUR) 120 MG 12 hr tablet    Sig: Take 1 tablet (120 mg total) by mouth every 12 (twelve) hours.    Dispense:  30 tablet    Refill:  0

## 2012-12-22 LAB — COMPREHENSIVE METABOLIC PANEL
ALT: 39 U/L — ABNORMAL HIGH (ref 0–35)
AST: 29 U/L (ref 0–37)
Albumin: 4.2 g/dL (ref 3.5–5.2)
Alkaline Phosphatase: 86 U/L (ref 39–117)
Glucose, Bld: 104 mg/dL — ABNORMAL HIGH (ref 70–99)
Potassium: 3.6 mEq/L (ref 3.5–5.3)
Sodium: 138 mEq/L (ref 135–145)
Total Protein: 7.5 g/dL (ref 6.0–8.3)

## 2012-12-25 LAB — CULTURE, GROUP A STREP: Organism ID, Bacteria: NORMAL

## 2013-01-10 ENCOUNTER — Ambulatory Visit (INDEPENDENT_AMBULATORY_CARE_PROVIDER_SITE_OTHER): Payer: BC Managed Care – PPO | Admitting: Physician Assistant

## 2013-01-10 VITALS — BP 118/82 | HR 78 | Temp 98.0°F | Resp 18 | Ht 60.5 in | Wt 225.0 lb

## 2013-01-10 DIAGNOSIS — R0981 Nasal congestion: Secondary | ICD-10-CM

## 2013-01-10 DIAGNOSIS — J309 Allergic rhinitis, unspecified: Secondary | ICD-10-CM

## 2013-01-10 DIAGNOSIS — J3489 Other specified disorders of nose and nasal sinuses: Secondary | ICD-10-CM

## 2013-01-10 DIAGNOSIS — J029 Acute pharyngitis, unspecified: Secondary | ICD-10-CM

## 2013-01-10 MED ORDER — IBUPROFEN 800 MG PO TABS: 800.0000 mg | ORAL_TABLET | Freq: Three times a day (TID) | ORAL | Status: DC | PRN

## 2013-01-10 MED ORDER — FIRST-DUKES MOUTHWASH MT SUSP: 10.0000 mL | OROMUCOSAL | Status: DC | PRN

## 2013-01-10 MED ORDER — FLUTICASONE PROPIONATE 50 MCG/ACT NA SUSP: 2.0000 | Freq: Every day | NASAL | Status: DC

## 2013-01-10 MED ORDER — CETIRIZINE HCL 10 MG PO TABS: 10.0000 mg | ORAL_TABLET | Freq: Every day | ORAL | Status: DC

## 2013-01-10 NOTE — Progress Notes (Signed)
Subjective:    Patient ID: Julia Fields, female    DOB: 10/30/1985, 27 y.o.   MRN: [id number]  HPI   Julia Fields is a pleasant 27 yr old female here with concern for illness.  Last seen here 12/21/12, thought to have likely viral illness, but possible developing ear infection, started on augmentin x 10 days.  Pt thinks she finished abx about 2 wks ago, still with symptoms.  Reports a bad sore throat - this is worst symptom.  Worse in the AM and at night when she lays down to go to bed.  Has not really used anything for sore throat - tried ibuprofen a couple times, but only took maybe 600mg  in total.  Also complains of bilateral ear aches.  Nasal congestion.  Productive cough - most productive first thing in the AM.  Has used nothing for cough or congestion.  Denies fever, facial pain, upper tooth pain.  Denies wheezing, SOB.  No history asthma.  Non-smoker.  States she was tested for strep by the nurse at work today - rapid strep negative.  Denies trouble with allergies.  States symptoms are not worse than before, but also not fully improved.  Throat never stopped hurting even with previous treatment.    Review of Systems  Constitutional: Negative for fever and chills.  HENT: Positive for congestion, ear pain, rhinorrhea and sore throat. Negative for sinus pressure.   Respiratory: Positive for cough. Negative for shortness of breath and wheezing.   Cardiovascular: Negative.   Gastrointestinal: Negative.   Musculoskeletal: Negative.   Skin: Negative.   Neurological: Negative.        Objective:   Physical Exam  Vitals reviewed. Constitutional: She is oriented to person, place, and time. She appears well-developed and well-nourished. No distress.  HENT:  Head: Normocephalic and atraumatic.  Right Ear: Ear canal normal. A middle ear effusion is present.  Left Ear: Ear canal normal. A middle ear effusion is present.  Nose: Nose normal. Right sinus exhibits no maxillary sinus tenderness and no  frontal sinus tenderness. Left sinus exhibits no maxillary sinus tenderness and no frontal sinus tenderness.  Mouth/Throat: Uvula is midline, oropharynx is clear and moist and mucous membranes are normal.  Neck: Normal range of motion. Neck supple.  Cardiovascular: Normal rate, regular rhythm and normal heart sounds.   Pulmonary/Chest: Effort normal and breath sounds normal. She has no wheezes. She has no rales.  Abdominal: Soft. There is no tenderness.  Lymphadenopathy:    She has no cervical adenopathy.  Neurological: She is alert and oriented to person, place, and time.  Skin: Skin is warm and dry.  Psychiatric: She has a normal mood and affect. Her behavior is normal.        Assessment & Plan:  Allergic rhinitis - Plan: fluticasone (FLONASE) 50 MCG/ACT nasal spray, cetirizine (ZYRTEC) 10 MG tablet  Nasal congestion - Plan: fluticasone (FLONASE) 50 MCG/ACT nasal spray, cetirizine (ZYRTEC) 10 MG tablet  Sore throat - Plan: Diphenhyd-Hydrocort-Nystatin (FIRST-DUKES MOUTHWASH) SUSP, ibuprofen (ADVIL,MOTRIN) 800 MG tablet, cetirizine (ZYRTEC) 10 MG tablet   Julia Fields is a pleasant 27 yr old female here with ongoing sore throat, congestion, cough despite course of augmentin 2 wks ago.  She is afebrile, lungs CTA, throat clear, reports rapid strep done at work was negative.  Suspect allergic rhinitis could be cause of symptoms, with post-nasal drainage causing her throat discomfort.  Will try Zyrtec and Flonase daily.  Magic mouthwash and ibuprofen for throat pain, though hopeful that  she will not need these for long with antihist and nasal steroid on board.  If symptoms worsening or not improving, pt to call or RTC

## 2013-01-10 NOTE — Patient Instructions (Signed)
I suspect that your symptoms may be due to allergies.  Take the cetirizine (Zyrtec) once daily - this will help with nasal congestion, post-nasal drainage.  Begin using the fluticasone (Flonase) 2 sprays each nostril once daily - this will also help with congestion, ear pressure, and post-nasal drainage.  The cetirizine and fluticasone work best with consistent daily use.    You can continue the ipratropium (Atrovent) nasal, just separate by 20-30 minutes from the Flonase.  Continue pseudoephedrine (Sudafed) for short term congestion relief (3-5 days).    Use the magic mouthwash every 2 hours if needed for sore throat.  Also take the ibuprofen 800mg  once every 8 hours with food.  My hope is that you will only need these for a couple days while the other medicines kick in.  If any symptoms are worsening or not improving, please let me know   Allergic Rhinitis Allergic rhinitis is when the mucous membranes in the nose respond to allergens. Allergens are particles in the air that cause your body to have an allergic reaction. This causes you to release allergic antibodies. Through a chain of events, these eventually cause you to release histamine into the blood stream (hence the use of antihistamines). Although meant to be protective to the body, it is this release that causes your discomfort, such as frequent sneezing, congestion and an itchy runny nose.  CAUSES  The pollen allergens may come from grasses, trees, and weeds. This is seasonal allergic rhinitis, or "hay fever." Other allergens cause year-round allergic rhinitis (perennial allergic rhinitis) such as house dust mite allergen, pet dander and mold spores.  SYMPTOMS   Nasal stuffiness (congestion).  Runny, itchy nose with sneezing and tearing of the eyes.  There is often an itching of the mouth, eyes and ears. It cannot be cured, but it can be controlled with medications. DIAGNOSIS  If you are unable to determine the offending allergen,  skin or blood testing may find it. TREATMENT   Avoid the allergen.  Medications and allergy shots (immunotherapy) can help.  Hay fever may often be treated with antihistamines in pill or nasal spray forms. Antihistamines block the effects of histamine. There are over-the-counter medicines that may help with nasal congestion and swelling around the eyes. Check with your caregiver before taking or giving this medicine. If the treatment above does not work, there are many new medications your caregiver can prescribe. Stronger medications may be used if initial measures are ineffective. Desensitizing injections can be used if medications and avoidance fails. Desensitization is when a patient is given ongoing shots until the body becomes less sensitive to the allergen. Make sure you follow up with your caregiver if problems continue. SEEK MEDICAL CARE IF:   You develop fever (more than 100.5 F (38.1 C).  You develop a cough that does not stop easily (persistent).  You have shortness of breath.  You start wheezing.  Symptoms interfere with normal daily activities. Document Released: 12/09/2000 Document Revised: 06/08/2011 Document Reviewed: 06/20/2008 [facility] Patient Information 2014 [place], [place].

## 2013-04-07 ENCOUNTER — Emergency Department (HOSPITAL_COMMUNITY)
Admission: EM | Admit: 2013-04-07 | Discharge: 2013-04-07 | Discharge status: Against Medical Advice | Payer: BC Managed Care – PPO | Attending: Emergency Medicine | Admitting: Emergency Medicine

## 2013-04-07 ENCOUNTER — Encounter (HOSPITAL_COMMUNITY): Payer: Self-pay | Admitting: Emergency Medicine

## 2013-04-07 DIAGNOSIS — R109 Unspecified abdominal pain: Secondary | ICD-10-CM | POA: Insufficient documentation

## 2013-04-07 LAB — COMPREHENSIVE METABOLIC PANEL
ALBUMIN: 3.7 g/dL (ref 3.5–5.2)
ALT: 22 U/L (ref 0–35)
AST: 20 U/L (ref 0–37)
Alkaline Phosphatase: 80 U/L (ref 39–117)
BUN: 16 mg/dL (ref 6–23)
CO2: 23 mEq/L (ref 19–32)
Calcium: 8.7 mg/dL (ref 8.4–10.5)
Chloride: 101 mEq/L (ref 96–112)
Creatinine, Ser: 0.72 mg/dL (ref 0.50–1.10)
GFR calc Af Amer: >90 mL/min (ref >90)
GFR calc non Af Amer: >90 mL/min (ref >90)
Glucose, Bld: 95 mg/dL (ref 70–99)
Potassium: 4.1 mEq/L (ref 3.7–5.3)
Sodium: 139 mEq/L (ref 137–147)
Total Bilirubin: 0.2 mg/dL — ABNORMAL LOW (ref 0.3–1.2)
Total Protein: 7.6 g/dL (ref 6.0–8.3)

## 2013-04-07 LAB — CBC WITH DIFFERENTIAL/PLATELET
BASOS ABS: 0 10*3/uL (ref 0.0–0.1)
BASOS PCT: 0 % (ref 0–1)
EOS PCT: 1 % (ref 0–5)
Eosinophils Absolute: 0.2 10*3/uL (ref 0.0–0.7)
HCT: 39.8 % (ref 36.0–46.0)
Hemoglobin: 13 g/dL (ref 12.0–15.0)
LYMPHS PCT: 16 % (ref 12–46)
Lymphs Abs: 2.4 10*3/uL (ref 0.7–4.0)
MCH: 24.5 pg — ABNORMAL LOW (ref 26.0–34.0)
MCHC: 32.7 g/dL (ref 30.0–36.0)
MCV: 75 fL — ABNORMAL LOW (ref 78.0–100.0)
Monocytes Absolute: 1.1 10*3/uL — ABNORMAL HIGH (ref 0.1–1.0)
Monocytes Relative: 7 % (ref 3–12)
Neutro Abs: 11.8 10*3/uL — ABNORMAL HIGH (ref 1.7–7.7)
Neutrophils Relative %: 76 % (ref 43–77)
PLATELETS: 273 10*3/uL (ref 150–400)
RBC: 5.31 MIL/uL — ABNORMAL HIGH (ref 3.87–5.11)
RDW: 15.3 % (ref 11.5–15.5)
WBC: 15.4 10*3/uL — AB (ref 4.0–10.5)

## 2013-04-07 LAB — HCG, QUANTITATIVE, PREGNANCY: hCG, Beta Chain, Quant, S: <1 m[IU]/mL (ref <5)

## 2013-04-07 MED ORDER — OXYCODONE-ACETAMINOPHEN 5-325 MG PO TABS
1.0000 | ORAL_TABLET | Freq: Once | ORAL | Status: AC
Start: 2013-04-07 — End: 2013-04-07
  Administered 2013-04-07: 1 via ORAL
  Filled 2013-04-07: qty 1

## 2013-04-07 NOTE — ED Notes (Signed)
Pt reports lower abd pain radiating to her Right flank, and vaginal area. Pt states her [facility] was 03/22/2013

## 2013-04-07 NOTE — ED Notes (Signed)
Per [facility] pt was at work, pt c/o lower abd pain radiating to her Right side. Pt denies nausea, vomiting, or diarrhea

## 2013-04-07 NOTE — ED Notes (Signed)
Asked pt to provide a urine specimen pt stated she could not provide one at this time.  

## 2013-05-07 ENCOUNTER — Ambulatory Visit (INDEPENDENT_AMBULATORY_CARE_PROVIDER_SITE_OTHER): Payer: BC Managed Care – PPO | Admitting: Emergency Medicine

## 2013-05-07 VITALS — BP 118/78 | HR 76 | Temp 97.7°F | Resp 16 | Ht 61.0 in | Wt 234.0 lb

## 2013-05-07 DIAGNOSIS — N898 Other specified noninflammatory disorders of vagina: Secondary | ICD-10-CM

## 2013-05-07 LAB — POCT WET PREP WITH KOH
KOH Prep POC: NEGATIVE
Trichomonas, UA: NEGATIVE
Yeast Wet Prep HPF POC: NEGATIVE

## 2013-05-07 MED ORDER — METRONIDAZOLE 500 MG PO TABS: 500.0000 mg | ORAL_TABLET | Freq: Two times a day (BID) | ORAL | Status: DC

## 2013-05-07 NOTE — Progress Notes (Signed)
   Subjective:    Patient ID: Julia Fields, female    DOB: 1985-09-06, 28 y.o.   MRN: [id number]  HPI patient has been bothered with a discharge the last week. She has a history of BV. She has a history of being sensitive to her husband's sperm. She has had an E. sure inserted for birth control. She is behind on her Pap smears. She has no history of abnormal Paps. She has one current partner    Review of Systems     Objective:   Physical Exam patient is alert and cooperative she is not ill-appearing. Abdomen is flat there are pelvic exam reveals a whitish type discharge with odor in the vaginal wall. Cervix appeared normal. There are  no adnexal masses there is no cervical motion tenderness.no areas of tenderness  Results for orders placed in visit on 05/07/13  POCT WET PREP WITH KOH      Result Value Range   Trichomonas, UA Negative     Clue Cells Wet Prep HPF POC 8-9     Epithelial Wet Prep HPF POC 1-3     Yeast Wet Prep HPF POC neg     Bacteria Wet Prep HPF POC 3+     RBC Wet Prep HPF POC 2-3     WBC Wet Prep HPF POC 4-5     KOH Prep POC Negative         Assessment & Plan:  Pap smear was done we'll treat his BP. Did have clue cells but no trichomoniasis and no hyphae

## 2013-05-07 NOTE — Patient Instructions (Signed)

## 2013-05-08 LAB — PAP IG, CT-NG, RFX HPV ASCU
Chlamydia Probe Amp: POSITIVE — AB
GC Probe Amp: NEGATIVE

## 2013-05-09 MED ORDER — AZITHROMYCIN 250 MG PO TABS: ORAL_TABLET | ORAL | Status: DC

## 2013-05-09 NOTE — Addendum Note (Signed)
Addended by: Elease EtienneHANSEN, SARA A on: 05/09/2013 10:02 AM   Modules accepted: Orders

## 2013-05-22 ENCOUNTER — Ambulatory Visit (INDEPENDENT_AMBULATORY_CARE_PROVIDER_SITE_OTHER): Payer: BC Managed Care – PPO | Admitting: Family Medicine

## 2013-05-22 VITALS — BP 118/66 | HR 62 | Temp 98.0°F | Resp 16 | Ht 61.0 in | Wt 227.0 lb

## 2013-05-22 DIAGNOSIS — N760 Acute vaginitis: Secondary | ICD-10-CM

## 2013-05-22 DIAGNOSIS — B9689 Other specified bacterial agents as the cause of diseases classified elsewhere: Secondary | ICD-10-CM

## 2013-05-22 DIAGNOSIS — Z202 Contact with and (suspected) exposure to infections with a predominantly sexual mode of transmission: Secondary | ICD-10-CM

## 2013-05-22 LAB — POCT WET PREP WITH KOH
Clue Cells Wet Prep HPF POC: 50
KOH PREP POC: NEGATIVE
RBC Wet Prep HPF POC: NEGATIVE
TRICHOMONAS UA: NEGATIVE
YEAST WET PREP PER HPF POC: NEGATIVE

## 2013-05-22 MED ORDER — AZITHROMYCIN 250 MG PO TABS: ORAL_TABLET | ORAL | Status: DC

## 2013-05-22 MED ORDER — METRONIDAZOLE 500 MG PO TABS: 500.0000 mg | ORAL_TABLET | Freq: Two times a day (BID) | ORAL | Status: DC

## 2013-05-22 NOTE — Patient Instructions (Signed)
Take the Zithromax all 4 pills at the same time ( azithromycin)  Take the metronidazole one twice daily breakfast and supper for bacterial vaginosis  Bacterial Vaginosis Bacterial vaginosis is a vaginal infection that occurs when the normal balance of bacteria in the vagina is disrupted. It results from an overgrowth of certain bacteria. This is the most common vaginal infection in women of childbearing age. Treatment is important to prevent complications, especially in pregnant women, as it can cause a premature delivery. CAUSES  Bacterial vaginosis is caused by an increase in harmful bacteria that are normally present in smaller amounts in the vagina. Several different kinds of bacteria can cause bacterial vaginosis. However, the reason that the condition develops is not fully understood. RISK FACTORS Certain activities or behaviors can put you at an increased risk of developing bacterial vaginosis, including:  Having a new sex partner or multiple sex partners.  Douching.  Using an intrauterine device (IUD) for contraception. Women do not get bacterial vaginosis from toilet seats, bedding, swimming pools, or contact with objects around them. SIGNS AND SYMPTOMS  Some women with bacterial vaginosis have no signs or symptoms. Common symptoms include:  Grey vaginal discharge.  A fishlike odor with discharge, especially after sexual intercourse.  Itching or burning of the vagina and vulva.  Burning or pain with urination. DIAGNOSIS  Your health care provider will take a medical history and examine the vagina for signs of bacterial vaginosis. A sample of vaginal fluid may be taken. Your health care provider will look at this sample under a microscope to check for bacteria and abnormal cells. A vaginal pH test may also be done.  TREATMENT  Bacterial vaginosis may be treated with antibiotic medicines. These may be given in the form of a pill or a vaginal cream. A second round of antibiotics  may be prescribed if the condition comes back after treatment.  HOME CARE INSTRUCTIONS   Only take over-the-counter or prescription medicines as directed by your health care provider.  If antibiotic medicine was prescribed, take it as directed. Make sure you finish it even if you start to feel better.  Do not have sex until treatment is completed.  Tell all sexual partners that you have a vaginal infection. They should see their health care provider and be treated if they have problems, such as a mild rash or itching.  Practice safe sex by using condoms and only having one sex partner. SEEK MEDICAL CARE IF:   Your symptoms are not improving after 3 days of treatment.  You have increased discharge or pain.  You have a fever. MAKE SURE YOU:   Understand these instructions.  Will watch your condition.  Will get help right away if you are not doing well or get worse. FOR MORE INFORMATION  Centers for Disease Control and Prevention, Division of STD Prevention: [employer]Apps.[employer].za American Sexual Health Association (ASHA): www.ashastd.org  Document Released: 03/16/2005 Document Revised: 01/04/2013 Document Reviewed: 10/26/2012 [facility] Patient Information 2014 [place], [place].   Chlamydia, Female Chlamydia is an infection. It is spread through sexual contact. Chlamydia can be in different areas of the body. These areas include the cervix, urethra, throat, or rectum. You may not know you have chlamydia because many people never develop the symptoms. Chlamydia is not difficult to treat once you know you have it. However, if it is left untreated, chlamydia can lead to more serious health problems.  CAUSES  Chlamydia is caused by bacteria. It is a sexually transmitted disease. It is  passed from an infected partner during intimate contact. This contact could be with the genitals, mouth, or rectal area. Chlamydia can also be passed from mothers to babies during birth. SIGNS AND SYMPTOMS  There  may not be any symptoms. This is often the case early in the infection. If symptoms develop, they may include:  Mild pain and discomfort when urinating.  Redness, soreness, and swelling (inflammation) of the rectum.  Vaginal discharge.  Painful intercourse.  Abdominal pain.  Bleeding between menstrual periods. DIAGNOSIS  To diagnose this infection, your health care provider will do a pelvic exam. Cultures will be taken of the vagina, cervix, urine, and possibly the rectum to verify the diagnosis.  TREATMENT You will be given antibiotic medicines. If you are pregnant, certain types of antibiotics will need to be avoided. Any sexual partners should also be treated, even if they do not show symptoms.  HOME CARE INSTRUCTIONS   Take your antibiotics as directed. Make sure you finish them even if you start to feel better.  Only take over-the-counter or prescription medicines for pain, discomfort, or fever as directed by your health care provider.  Inform any sexual partners about the infection. They should also be treated.  Do not have sexual contact until your health care provider tells you it is okay.  Get plenty of rest.  Eat a well-balanced diet.  Drink enough fluids to keep your urine clear or pale yellow.  Follow up with your health care provider as directed. SEEK MEDICAL CARE IF:  You have painful urination.  You have abdominal pain.  You have vaginal discharge.  You have painful sexual intercourse.  You are a woman and have bleeding between periods and after sex. SEEK IMMEDIATE MEDICAL CARE IF:   You have a fever or persistent symptoms for more than 2 3 days.  You have a fever and your symptoms suddenly get worse.  You experience nausea or vomiting.  You experience excessive sweating (diaphoresis).  You have difficulty swallowing. MAKE SURE YOU:   Understand these instructions.  Will watch your condition.  Will get help right away if you are not doing  well or get worse. Document Released: 12/24/2004 Document Revised: 01/04/2013 Document Reviewed: 11/21/2012 [facility] Patient Information 2014 [place], [place].
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
## 2013-05-23 LAB — GC/CHLAMYDIA PROBE AMP
CT Probe RNA: NEGATIVE
GC PROBE AMP APTIMA: NEGATIVE

## 2013-05-23 LAB — RPR

## 2013-05-23 LAB — HIV ANTIBODY (ROUTINE TESTING W REFLEX): HIV: NONREACTIVE

## 2013-05-24 LAB — HSV(HERPES SIMPLEX VRS) I + II AB-IGG
HSV 1 Glycoprotein G Ab, IgG: 0.22 IV
HSV 2 Glycoprotein G Ab, IgG: 2.44 IV — ABNORMAL HIGH

## 2013-10-14 ENCOUNTER — Ambulatory Visit (INDEPENDENT_AMBULATORY_CARE_PROVIDER_SITE_OTHER): Payer: BC Managed Care – PPO | Admitting: Family Medicine

## 2013-10-14 VITALS — BP 116/66 | HR 72 | Temp 98.3°F | Resp 16 | Ht 61.0 in | Wt 230.0 lb

## 2013-10-14 DIAGNOSIS — L293 Anogenital pruritus, unspecified: Secondary | ICD-10-CM

## 2013-10-14 DIAGNOSIS — N898 Other specified noninflammatory disorders of vagina: Secondary | ICD-10-CM

## 2013-10-14 DIAGNOSIS — N760 Acute vaginitis: Secondary | ICD-10-CM

## 2013-10-14 LAB — POCT WET PREP WITH KOH
CLUE CELLS WET PREP PER HPF POC: NEGATIVE
KOH Prep POC: NEGATIVE
TRICHOMONAS UA: NEGATIVE
Yeast Wet Prep HPF POC: NEGATIVE

## 2013-10-14 MED ORDER — METRONIDAZOLE 500 MG PO TABS: 500.0000 mg | ORAL_TABLET | Freq: Two times a day (BID) | ORAL | Status: DC

## 2013-10-14 NOTE — Progress Notes (Signed)
Subjective: [age] year old lady who has been having vaginitis symptoms of weeks. She's had some discharge. Mild itching. Not much burning. She says the discharge is been verified. She's been treated in the past for bacterial vaginosis. She just got divorced, but has been with same boyfriend for one year. Has not been with anyone else during that time. She did have Chlamydia once in the past and her husband was cheating. She had her menstrual cycle a couple of weeks ago and this began after that. Has a permanent device contraceptive implant intrauterine. 4 months ago she was tested for STD as was her boyfriend.  Objective: Pleasant young lady in no acute distress. She is overweight. External genitalia appear normal. Vaginal mucosa has a thick mucoid discharge. Does not look like a pathologic vaginitis at this time. No pus from the os. Wet prep taken, as well as a Gen-Probe. Patient tolerated the procedure well. Bimanual exam is normal.  Assessment: Possible vaginitis  Plan: Wet prep After examining her the patient had some questions about the anatomy which we attempted to answer. I think everything is physiologically normal on her.  Results for orders placed in visit on 10/14/13  POCT WET PREP WITH KOH      Result Value Ref Range   Trichomonas, UA Negative     Clue Cells Wet Prep HPF POC neg     Epithelial Wet Prep HPF POC 2-3     Yeast Wet Prep HPF POC neg     Bacteria Wet Prep HPF POC 1+     RBC Wet Prep HPF POC 0-1     WBC Wet Prep HPF POC 2-4     KOH Prep POC Negative     Will give a prescription for Flagyl to use if symptoms get more significant. Advise using condoms and see if that helped eliminate the discharge. Return if problems persist

## 2013-10-14 NOTE — Patient Instructions (Signed)
If symptoms get more significant go ahead and take Flagyl 500 mg twice for one week. (Metronidazole)  Use condoms

## 2013-10-16 LAB — GC/CHLAMYDIA PROBE AMP
CT PROBE, AMP APTIMA: NEGATIVE
GC Probe RNA: NEGATIVE

## 2013-10-19 ENCOUNTER — Ambulatory Visit (INDEPENDENT_AMBULATORY_CARE_PROVIDER_SITE_OTHER): Payer: BC Managed Care – PPO | Admitting: Family Medicine

## 2013-10-19 VITALS — BP 114/72 | HR 83 | Temp 98.2°F | Resp 18 | Ht 61.0 in | Wt 230.0 lb

## 2013-10-19 DIAGNOSIS — R509 Fever, unspecified: Secondary | ICD-10-CM

## 2013-10-19 DIAGNOSIS — J029 Acute pharyngitis, unspecified: Secondary | ICD-10-CM

## 2013-10-19 LAB — POCT RAPID STREP A (OFFICE): RAPID STREP A SCREEN: NEGATIVE

## 2013-10-19 MED ORDER — AZITHROMYCIN 250 MG PO TABS: ORAL_TABLET | ORAL | Status: DC

## 2013-10-19 MED ORDER — MAGIC MOUTHWASH W/LIDOCAINE: 10.0000 mL | ORAL | Status: DC | PRN

## 2013-10-19 NOTE — Patient Instructions (Signed)

## 2013-10-19 NOTE — Progress Notes (Signed)
Subjective:  This chart was scribed for Norberto Sorenson, MD by Charline Bills, ED Scribe. The patient was seen in room 2. Patient's care was started at 3:14 PM.   Patient ID: Julia Fields, female    DOB: 1985/05/21, 28 y.o.   MRN: [id number]  Chief Complaint  Patient presents with  . Sore Throat    since yesterday   . sinus congestion  . Otalgia  . Headache  . Generalized Body Aches  . Fatigue  . Fever    today 99.8   HPI HPI Comments: Julia Fields is a 28 y.o. female who presents to the [facility] complaining of itchy throat onset 2 days ago. She reports associated itching to bilateral ears, ear pain, sore throat, hoarseness, nasal congestion, HA in forehead region, postnasal drip, body aches, loss of appetite, difficulty sleeping due to pain, fever. Tmax 99.8 F, office temperature 98.2 F. She denies sweats, chills, sinus pressure, cough, SOB, chest pain, nausea, vomiting, change in urine or bowels. 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 No sick contacts. Pt denies h/o mono  History reviewed. No pertinent past medical history. Current Outpatient Prescriptions on File Prior to Visit  Medication Sig Dispense Refill  . metroNIDAZOLE (FLAGYL) 500 MG tablet Take 1 tablet (500 mg total) by mouth 2 (two) times daily with a meal. DO NOT CONSUME ALCOHOL WHILE TAKING THIS MEDICATION.  14 tablet  0   No current facility-administered medications on file prior to visit.   No Known Allergies  Review of Systems  Constitutional: Positive for fever (mild) and appetite change. Negative for chills and diaphoresis.  HENT: Positive for congestion, ear pain, postnasal drip, sore throat and voice change. Negative for sinus pressure.   Respiratory: Negative for cough and shortness of breath.   Cardiovascular: Negative for chest pain.  Gastrointestinal: Negative for nausea, vomiting, diarrhea and constipation.  Genitourinary: Negative for  dysuria and hematuria.  Musculoskeletal: Positive for myalgias.  Neurological: Positive for headaches.   Triage Vitals: BP 114/72  Pulse 83  Temp(Src) 98.2 F (36.8 C) (Oral)  Resp 18  Ht 5\' 1"  (1.549 m)  Wt 230 lb (104.327 kg)  BMI 43.48 kg/m2  SpO2 100%  LMP 10/04/2013    Objective:   Physical Exam  Nursing note and vitals reviewed. Constitutional: She is oriented to person, place, and time. She appears well-developed and well-nourished. No distress.  HENT:  Head: Normocephalic and atraumatic.  Right Ear: A middle ear effusion (large) is present.  Left Ear: A middle ear effusion (large) is present.  Nose: Nose normal.  Mouth/Throat: Posterior oropharyngeal erythema present. No oropharyngeal exudate or posterior oropharyngeal edema.  1+ tonsils  Eyes: Conjunctivae and EOM are normal.  Neck: Neck supple. No tracheal deviation present.  Cardiovascular: Normal rate.   Pulmonary/Chest: Effort normal. No respiratory distress.  Musculoskeletal: Normal range of motion.  Lymphadenopathy:       Head (right side): Submandibular, tonsillar and posterior auricular adenopathy present.       Head (left side): Submandibular, tonsillar and posterior auricular adenopathy present.    She has cervical adenopathy (anterior bilaterally).       Right cervical: Posterior cervical adenopathy present.       Left cervical: Posterior cervical adenopathy present.  Firm subcutaneous pea sized cyst over L tonsillar node  Neurological: She is alert and oriented to person, place, and time.  Skin: Skin is warm and dry.  Psychiatric: She has a normal  mood and affect. Her behavior is normal.      Results for orders placed in visit on 10/19/13  CULTURE, GROUP A STREP      Result Value Ref Range   Organism ID, Bacteria Normal Upper Respiratory Flora     Organism ID, Bacteria No Beta Hemolytic Streptococci Isolated    POCT RAPID STREP A (OFFICE)      Result Value Ref Range   Rapid Strep A Screen  Negative  Negative    Assessment & Plan:   Sore throat - Plan: POCT rapid strep A, Culture, Group A Strep Recommended cbc but pt declines labs  - really just wants antibiotic and work note. RTC if not improving in 3-4d  Fever, unspecified  Meds ordered this encounter  Medications  . azithromycin (ZITHROMAX) 250 MG tablet    Sig: Take 2 tabs PO x 1 dose, then 1 tab PO QD x 4 days    Dispense:  6 tablet    Refill:  0  . Alum & Mag Hydroxide-Simeth (MAGIC MOUTHWASH W/LIDOCAINE) SOLN    Sig: Take 10 mLs by mouth every 2 (two) hours as needed for mouth pain. Mix 1:1 ration with lidocaine    Dispense:  360 mL    Refill:  0    I personally performed the services described in this documentation, which was scribed in my presence. The recorded information has been reviewed and considered, and addended by me as needed.  Norberto SorensonEva Marvie Brevik, MD MPH

## 2013-10-21 LAB — CULTURE, GROUP A STREP: ORGANISM ID, BACTERIA: NORMAL

## 2014-03-06 ENCOUNTER — Encounter (HOSPITAL_COMMUNITY): Payer: Self-pay | Admitting: Emergency Medicine

## 2014-03-06 ENCOUNTER — Other Ambulatory Visit (HOSPITAL_COMMUNITY)
Admission: RE | Admit: 2014-03-06 | Discharge: 2014-03-06 | Disposition: A | Discharge status: Home / Self Care | Payer: BC Managed Care – PPO | Source: Ambulatory Visit | Attending: Emergency Medicine | Admitting: Emergency Medicine

## 2014-03-06 ENCOUNTER — Emergency Department (HOSPITAL_COMMUNITY)
Admission: EM | Admit: 2014-03-06 | Discharge: 2014-03-06 | Disposition: A | Discharge status: Home / Self Care | Payer: BC Managed Care – PPO | Source: Home / Self Care | Attending: Emergency Medicine | Admitting: Emergency Medicine

## 2014-03-06 DIAGNOSIS — Z113 Encounter for screening for infections with a predominantly sexual mode of transmission: Secondary | ICD-10-CM | POA: Insufficient documentation

## 2014-03-06 DIAGNOSIS — B9689 Other specified bacterial agents as the cause of diseases classified elsewhere: Secondary | ICD-10-CM

## 2014-03-06 DIAGNOSIS — N760 Acute vaginitis: Secondary | ICD-10-CM | POA: Insufficient documentation

## 2014-03-06 DIAGNOSIS — A499 Bacterial infection, unspecified: Secondary | ICD-10-CM

## 2014-03-06 DIAGNOSIS — N898 Other specified noninflammatory disorders of vagina: Secondary | ICD-10-CM

## 2014-03-06 MED ORDER — METRONIDAZOLE 500 MG PO TABS: 500.0000 mg | ORAL_TABLET | Freq: Two times a day (BID) | ORAL | Status: DC

## 2014-03-06 NOTE — ED Notes (Signed)
C/o vaginal odor and mild irritation.  Symptoms present x 3 days.  Denies pelvic/abdominal pain.  No n/v

## 2014-03-06 NOTE — ED Provider Notes (Signed)
CSN: [id number]     Arrival date & time 03/06/14  1837 History   First MD Initiated Contact with Patient 03/06/14 1943     Chief Complaint  Patient presents with  . Vaginal Itching   (Consider location/radiation/quality/duration/timing/severity/associated sxs/prior Treatment) HPI Comments: 28 year old moderate to severely obese female complaining of vaginal itching, odor and discharge for 1 week. Denies pelvic pain, fever or GI symptoms. LMP December 1.   History reviewed. No pertinent past medical history. Past Surgical History  Procedure Laterality Date  . Neck surgery      cyst removed.  . Tubal ligation     History reviewed. No pertinent family history. History  Substance Use Topics  . Smoking status: Current Some Day Smoker  . Smokeless tobacco: Not on file  . Alcohol Use: Yes   OB History    No data available     Review of Systems  Constitutional: Negative.   Respiratory: Negative.   Gastrointestinal: Negative.   Genitourinary: Positive for vaginal discharge. Negative for dysuria, frequency, flank pain, decreased urine volume, menstrual problem and pelvic pain.    Allergies  Review of patient's allergies indicates no known allergies.  Home Medications   Prior to Admission medications   Medication Sig Start Date End Date Taking? Authorizing Provider  Alum & Mag Hydroxide-Simeth (MAGIC MOUTHWASH W/LIDOCAINE) SOLN Take 10 mLs by mouth every 2 (two) hours as needed for mouth pain. Mix 1:1 ration with lidocaine 10/19/13   Sherren MochaEva N Shaw, MD  azithromycin (ZITHROMAX) 250 MG tablet Take 2 tabs PO x 1 dose, then 1 tab PO QD x 4 days 10/19/13   Sherren MochaEva N Shaw, MD  metroNIDAZOLE (FLAGYL) 500 MG tablet Take 1 tablet (500 mg total) by mouth 2 (two) times daily. X 7 days 03/06/14   Hayden Rasmussenavid Folasade Mooty, NP   BP 124/79 mmHg  Pulse 66  Temp(Src) 97.6 F (36.4 C) (Oral)  Resp 16  SpO2 100%  LMP 02/27/2014 Physical Exam  Constitutional: She is oriented to person, place, and time. She appears  well-developed and well-nourished. No distress.  Neck: Normal range of motion. Neck supple.  Cardiovascular: Normal rate, regular rhythm and normal heart sounds.   Pulmonary/Chest: Effort normal. No respiratory distress.  Genitourinary:  NEFG Thin gray vaginal discharge. CX midline, pink. Nabothian cysts. No ectocervical erythema or tenderness.   Neurological: She is alert and oriented to person, place, and time.  Skin: Skin is warm and dry.  Psychiatric: She has a normal mood and affect.  Nursing note and vitals reviewed.   ED Course  Procedures (including critical care time) Labs Review Labs Reviewed  CERVICOVAGINAL ANCILLARY ONLY    Imaging Review No results found.   MDM   1. Vaginal discharge   2. BV (bacterial vaginosis)    Flagyl Cytology pending.    Hayden Rasmussenavid Channah Godeaux, NP 03/06/14 2004

## 2014-03-06 NOTE — Discharge Instructions (Signed)
Bacterial Vaginosis Bacterial vaginosis is a vaginal infection that occurs when the normal balance of bacteria in the vagina is disrupted. It results from an overgrowth of certain bacteria. This is the most common vaginal infection in women of childbearing age. Treatment is important to prevent complications, especially in pregnant women, as it can cause a premature delivery. CAUSES  Bacterial vaginosis is caused by an increase in harmful bacteria that are normally present in smaller amounts in the vagina. Several different kinds of bacteria can cause bacterial vaginosis. However, the reason that the condition develops is not fully understood. RISK FACTORS Certain activities or behaviors can put you at an increased risk of developing bacterial vaginosis, including:  Having a new sex partner or multiple sex partners.  Douching.  Using an intrauterine device (IUD) for contraception. Women do not get bacterial vaginosis from toilet seats, bedding, swimming pools, or contact with objects around them. SIGNS AND SYMPTOMS  Some women with bacterial vaginosis have no signs or symptoms. Common symptoms include:  Grey vaginal discharge.  A fishlike odor with discharge, especially after sexual intercourse.  Itching or burning of the vagina and vulva.  Burning or pain with urination. DIAGNOSIS  Your health care provider will take a medical history and examine the vagina for signs of bacterial vaginosis. A sample of vaginal fluid may be taken. Your health care provider will look at this sample under a microscope to check for bacteria and abnormal cells. A vaginal pH test may also be done.  TREATMENT  Bacterial vaginosis may be treated with antibiotic medicines. These may be given in the form of a pill or a vaginal cream. A second round of antibiotics may be prescribed if the condition comes back after treatment.  HOME CARE INSTRUCTIONS   Only take over-the-counter or prescription medicines as  directed by your health care provider.  If antibiotic medicine was prescribed, take it as directed. Make sure you finish it even if you start to feel better.  Do not have sex until treatment is completed.  Tell all sexual partners that you have a vaginal infection. They should see their health care provider and be treated if they have problems, such as a mild rash or itching.  Practice safe sex by using condoms and only having one sex partner. SEEK MEDICAL CARE IF:   Your symptoms are not improving after 3 days of treatment.  You have increased discharge or pain.  You have a fever. MAKE SURE YOU:   Understand these instructions.  Will watch your condition.  Will get help right away if you are not doing well or get worse. FOR MORE INFORMATION  Centers for Disease Control and Prevention, Division of STD Prevention: www.cdc.gov/std American Sexual Health Association (ASHA): www.ashastd.org  Document Released: 03/16/2005 Document Revised: 01/04/2013 Document Reviewed: 10/26/2012 ExitCare Patient Information 2015 [employer]. This information is not intended to replace advice given to you by your health care provider. Make sure you discuss any questions you have with your health care provider.  

## 2014-03-07 LAB — CERVICOVAGINAL ANCILLARY ONLY
Chlamydia: NEGATIVE
Neisseria Gonorrhea: NEGATIVE

## 2014-03-08 LAB — CERVICOVAGINAL ANCILLARY ONLY
WET PREP (BD AFFIRM): NEGATIVE
Wet Prep (BD Affirm): NEGATIVE
Wet Prep (BD Affirm): POSITIVE — AB

## 2014-03-08 NOTE — ED Notes (Signed)
GC/Chlamydia neg., Affirm: Candida and Trich neg., Gardnerella pos.  Pt. adequately treated with Flagyl. Vassie MoselleYork, Inigo Lantigua M 03/08/2014

## 2014-04-06 ENCOUNTER — Ambulatory Visit: Payer: Self-pay

## 2015-07-15 ENCOUNTER — Ambulatory Visit (INDEPENDENT_AMBULATORY_CARE_PROVIDER_SITE_OTHER): Payer: 59 | Admitting: Urgent Care

## 2015-07-15 VITALS — BP 114/70 | HR 81 | Temp 97.6°F | Resp 15 | Ht 60.5 in | Wt 189.2 lb

## 2015-07-15 DIAGNOSIS — N898 Other specified noninflammatory disorders of vagina: Secondary | ICD-10-CM | POA: Diagnosis not present

## 2015-07-15 DIAGNOSIS — A499 Bacterial infection, unspecified: Secondary | ICD-10-CM | POA: Diagnosis not present

## 2015-07-15 DIAGNOSIS — B9689 Other specified bacterial agents as the cause of diseases classified elsewhere: Secondary | ICD-10-CM

## 2015-07-15 DIAGNOSIS — N760 Acute vaginitis: Secondary | ICD-10-CM | POA: Diagnosis not present

## 2015-07-15 LAB — POCT URINALYSIS DIP (MANUAL ENTRY)
BILIRUBIN UA: NEGATIVE
Bilirubin, UA: NEGATIVE
Glucose, UA: NEGATIVE
Leukocytes, UA: NEGATIVE
Nitrite, UA: NEGATIVE
PH UA: 5.5
PROTEIN UA: NEGATIVE
RBC UA: NEGATIVE
SPEC GRAV UA: 1.025
Urobilinogen, UA: 0.2

## 2015-07-15 LAB — POC MICROSCOPIC URINALYSIS (UMFC)

## 2015-07-15 LAB — POCT WET + KOH PREP
Trich by wet prep: ABSENT
Yeast by KOH: ABSENT
Yeast by wet prep: ABSENT

## 2015-07-15 MED ORDER — METRONIDAZOLE 500 MG PO TABS: 500.0000 mg | ORAL_TABLET | Freq: Two times a day (BID) | ORAL | Status: DC

## 2015-07-15 NOTE — Progress Notes (Signed)
    MRN: [id number] DOB: May 10, 1985  Subjective:   Julia Fields is a 30 y.o. female presenting for chief complaint of Vaginal Discharge  Reports ~1 week history of odorous vaginal discharge. Patient is sexually active, uses condoms for protection. Has had Essure Procedure for contraception completed in 2011. Since then, has had pelvic pain, intermittent episodes of vaginal discharge. Denies fever, genital rashes, dysuria, hematuria, pelvic pain, n/v, abdominal pain.  Julia Fields currently has no medications in their medication list. Also has No Known Allergies.  Julia Fields  has no past medical history on file. Also  has past surgical history that includes Neck surgery and Tubal ligation.  Objective:   Vitals: BP 114/70 mmHg  Pulse 81  Temp(Src) 97.6 F (36.4 C) (Oral)  Resp 15  Ht 5' 0.5" (1.537 m)  Wt 189 lb 3.2 oz (85.821 kg)  BMI 36.33 kg/m2  SpO2 99%  LMP 07/10/2015  Physical Exam  Constitutional: She is oriented to person, place, and time. She appears well-developed and well-nourished.  Cardiovascular: Normal rate, regular rhythm and intact distal pulses.  Exam reveals no gallop and no friction rub.   No murmur heard. Pulmonary/Chest: No respiratory distress. She has no wheezes. She has no rales.  Abdominal: Soft. Bowel sounds are normal. She exhibits no distension and no mass. There is no tenderness.  Neurological: She is alert and oriented to person, place, and time.  Skin: Skin is warm and dry.   Results for orders placed or performed in visit on 07/15/15 (from the past 24 hour(s))  POCT Microscopic Urinalysis ([facility])     Status: Abnormal   Collection Time: 07/15/15 10:20 AM  Result Value Ref Range   WBC,UR,HPF,POC None None WBC/hpf   RBC,UR,HPF,POC None None RBC/hpf   Bacteria None None, Too numerous to count   Mucus Present (A) Absent   Epithelial Cells, UR Per Microscopy Few (A) None, Too numerous to count cells/hpf  POCT urinalysis dipstick     Status: None   Collection  Time: 07/15/15 10:20 AM  Result Value Ref Range   Color, UA yellow yellow   Clarity, UA clear clear   Glucose, UA negative negative   Bilirubin, UA negative negative   Ketones, POC UA negative negative   Spec Grav, UA 1.025    Blood, UA negative negative   pH, UA 5.5    Protein Ur, POC negative negative   Urobilinogen, UA 0.2    Nitrite, UA Negative Negative   Leukocytes, UA Negative Negative  POCT Wet + KOH Prep     Status: Abnormal   Collection Time: 07/15/15 10:21 AM  Result Value Ref Range   Yeast by KOH Absent Present, Absent   Yeast by wet prep Absent Present, Absent   WBC by wet prep None None, Few, Too numerous to count   Clue Cells Wet Prep HPF POC Few (A) None, Too numerous to count   Trich by wet prep Absent Present, Absent   Bacteria Wet Prep HPF POC None None, Few, Too numerous to count   Epithelial Cells By Principal FinancialWet Pref ([facility]) Few None, Few, Too numerous to count   RBC,UR,HPF,POC Few (A) None RBC/hpf   Assessment and Plan :   1. Bacterial vaginosis 2. Vaginal discharge - Patient insisted on getting Flagyl due to her history of BV. I agreed to this but advised patient rtc in 1 week if symptoms persist.  Wallis BambergMario Quisha Mabie, PA-C [facility] [phone] 07/15/2015 9:54 AM

## 2015-07-15 NOTE — Patient Instructions (Addendum)
 Bacterial Vaginosis Bacterial vaginosis is a vaginal infection that occurs when the normal balance of bacteria in the vagina is disrupted. It results from an overgrowth of certain bacteria. This is the most common vaginal infection in women of childbearing age. Treatment is important to prevent complications, especially in pregnant women, as it can cause a premature delivery. CAUSES  Bacterial vaginosis is caused by an increase in harmful bacteria that are normally present in smaller amounts in the vagina. Several different kinds of bacteria can cause bacterial vaginosis. However, the reason that the condition develops is not fully understood. RISK FACTORS Certain activities or behaviors can put you at an increased risk of developing bacterial vaginosis, including:  Having a new sex partner or multiple sex partners.  Douching.  Using an intrauterine device (IUD) for contraception. Women do not get bacterial vaginosis from toilet seats, bedding, swimming pools, or contact with objects around them. SIGNS AND SYMPTOMS  Some women with bacterial vaginosis have no signs or symptoms. Common symptoms include:  Grey vaginal discharge.  A fishlike odor with discharge, especially after sexual intercourse.  Itching or burning of the vagina and vulva.  Burning or pain with urination. DIAGNOSIS  Your health care provider will take a medical history and examine the vagina for signs of bacterial vaginosis. A sample of vaginal fluid may be taken. Your health care provider will look at this sample under a microscope to check for bacteria and abnormal cells. A vaginal pH test may also be done.  TREATMENT  Bacterial vaginosis may be treated with antibiotic medicines. These may be given in the form of a pill or a vaginal cream. A second round of antibiotics may be prescribed if the condition comes back after treatment. Because bacterial vaginosis increases your risk for sexually transmitted diseases,  getting treated can help reduce your risk for chlamydia, gonorrhea, HIV, and herpes. HOME CARE INSTRUCTIONS   Only take over-the-counter or prescription medicines as directed by your health care provider.  If antibiotic medicine was prescribed, take it as directed. Make sure you finish it even if you start to feel better.  Tell all sexual partners that you have a vaginal infection. They should see their health care provider and be treated if they have problems, such as a mild rash or itching.  During treatment, it is important that you follow these instructions:  Avoid sexual activity or use condoms correctly.  Do not douche.  Avoid alcohol as directed by your health care provider.  Avoid breastfeeding as directed by your health care provider. SEEK MEDICAL CARE IF:   Your symptoms are not improving after 3 days of treatment.  You have increased discharge or pain.  You have a fever. MAKE SURE YOU:   Understand these instructions.  Will watch your condition.  Will get help right away if you are not doing well or get worse. FOR MORE INFORMATION  Centers for Disease Control and Prevention, Division of STD Prevention: www.cdc.gov/std American Sexual Health Association (ASHA): www.ashastd.org    This information is not intended to replace advice given to you by your health care provider. Make sure you discuss any questions you have with your health care provider.   Document Released: 03/16/2005 Document Revised: 04/06/2014 Document Reviewed: 10/26/2012 Elsevier Interactive Patient Education 2016 Elsevier Inc.    IF you received an x-ray today, you will receive an invoice from [facility]. Please contact [facility] at [phone] with questions or concerns regarding your invoice.   IF you received labwork   today, you will receive an invoice from [facility]. Please contact [facility] at [phone] with questions or concerns regarding  your invoice.   Our billing staff will not be able to assist you with questions regarding bills from these companies.  You will be contacted with the lab results as soon as they are available. The fastest way to get your results is to activate your My Chart account. Instructions are located on the last page of this paperwork. If you have not heard from us regarding the results in 2 weeks, please contact this office.      

## 2016-04-25 ENCOUNTER — Encounter (HOSPITAL_COMMUNITY): Payer: Self-pay | Admitting: Family Medicine

## 2016-04-25 ENCOUNTER — Ambulatory Visit (HOSPITAL_COMMUNITY)
Admission: EM | Admit: 2016-04-25 | Discharge: 2016-04-25 | Disposition: A | Discharge status: Home / Self Care | Payer: 59 | Attending: Family Medicine | Admitting: Family Medicine

## 2016-04-25 DIAGNOSIS — N898 Other specified noninflammatory disorders of vagina: Secondary | ICD-10-CM

## 2016-04-25 DIAGNOSIS — N760 Acute vaginitis: Secondary | ICD-10-CM

## 2016-04-25 DIAGNOSIS — B9689 Other specified bacterial agents as the cause of diseases classified elsewhere: Secondary | ICD-10-CM | POA: Diagnosis not present

## 2016-04-25 DIAGNOSIS — Z711 Person with feared health complaint in whom no diagnosis is made: Secondary | ICD-10-CM

## 2016-04-25 LAB — POCT URINALYSIS DIP (DEVICE)
Bilirubin Urine: NEGATIVE
Glucose, UA: NEGATIVE mg/dL
Ketones, ur: NEGATIVE mg/dL
LEUKOCYTES UA: NEGATIVE
Nitrite: NEGATIVE
Protein, ur: NEGATIVE mg/dL
SPECIFIC GRAVITY, URINE: 1.02 (ref 1.005–1.030)
UROBILINOGEN UA: 0.2 mg/dL (ref 0.0–1.0)
pH: 6 (ref 5.0–8.0)

## 2016-04-25 LAB — POCT PREGNANCY, URINE: Preg Test, Ur: NEGATIVE

## 2016-04-25 MED ORDER — METRONIDAZOLE 500 MG PO TABS: 500.0000 mg | ORAL_TABLET | Freq: Two times a day (BID) | ORAL | 0 refills | Status: DC

## 2016-04-25 NOTE — ED Provider Notes (Signed)
CSN: [id number]     Arrival date & time 04/25/16  1206 History   First MD Initiated Contact with Patient 04/25/16 1300     Chief Complaint  Patient presents with  . Vaginal Discharge   (Consider location/radiation/quality/duration/timing/severity/associated sxs/prior Treatment) Patient c/o vaginal DC.  She admits to being sexually active recently unprotected.  She states the DC is like the DC she gets when she gets BV.   The history is provided by the patient.  Vaginal Discharge  Quality:  White Severity:  Moderate Onset quality:  Sudden Duration:  2 days Timing:  Intermittent Progression:  Worsening Chronicity:  New Relieved by:  Nothing Worsened by:  Nothing Ineffective treatments:  None tried Associated symptoms: vaginal itching     History reviewed. No pertinent past medical history. Past Surgical History:  Procedure Laterality Date  . NECK SURGERY     cyst removed.  . TUBAL LIGATION     History reviewed. No pertinent family history. Social History  Substance Use Topics  . Smoking status: Never Smoker  . Smokeless tobacco: Never Used  . Alcohol use Yes   OB History    No data available     Review of Systems  Constitutional: Negative.   HENT: Negative.   Eyes: Negative.   Respiratory: Negative.   Cardiovascular: Negative.   Gastrointestinal: Negative.   Endocrine: Negative.   Genitourinary: Positive for vaginal discharge.  Allergic/Immunologic: Negative.   Neurological: Negative.   Hematological: Negative.   Psychiatric/Behavioral: Negative.     Allergies  Patient has no known allergies.  Home Medications   Prior to Admission medications   Medication Sig Start Date End Date Taking? Authorizing Provider  metroNIDAZOLE (FLAGYL) 500 MG tablet Take 1 tablet (500 mg total) by mouth 2 (two) times daily. 04/25/16   Deatra Canter, FNP   Meds Ordered and Administered this Visit  Medications - No data to display  BP 131/76 (BP Location: Left Wrist)    Pulse (!) 56   Temp 97.9 F (36.6 C) (Oral)   Resp 16   SpO2 100%  No data found.   Physical Exam  Constitutional: She appears well-developed and well-nourished.  HENT:  Head: Normocephalic and atraumatic.  Right Ear: External ear normal.  Left Ear: External ear normal.  Mouth/Throat: Oropharynx is clear and moist.  Eyes: Conjunctivae and EOM are normal. Pupils are equal, round, and reactive to light.  Neck: Normal range of motion. Neck supple.  Cardiovascular: Normal rate, regular rhythm and normal heart sounds.   Pulmonary/Chest: Effort normal and breath sounds normal.  Abdominal: Soft. Bowel sounds are normal.  Genitourinary: Vaginal discharge found.  Genitourinary Comments: BUS wnl Vagina with white clear discharge Cervix parous os w/o tenderness Adnexa - no tenderness bilateral  Nursing note and vitals reviewed.   Urgent Care Course     Procedures (including critical care time)  Labs Review Labs Reviewed  POCT URINALYSIS DIP (DEVICE) - Abnormal; Notable for the following:       Result Value   Hgb urine dipstick TRACE (*)    All other components within normal limits  POCT PREGNANCY, URINE  CERVICOVAGINAL ANCILLARY ONLY    Imaging Review No results found.   Visual Acuity Review  Right Eye Distance:   Left Eye Distance:   Bilateral Distance:    Right Eye Near:   Left Eye Near:    Bilateral Near:         MDM   1. BV (bacterial vaginosis)   2. Vaginal  discharge   3. Concern about STD in female without diagnosis    Flagyl 500mg  one po bid x 7 days #14 Endocervical cytology GC/ Chlamydia, BV Affirm      Deatra CanterWilliam J Treylan Mcclintock, FNP 04/25/16 1324

## 2016-04-25 NOTE — ED Triage Notes (Signed)
Pt here for vaginal discharge and reoccurrence of BV

## 2016-04-28 LAB — CERVICOVAGINAL ANCILLARY ONLY
Chlamydia: NEGATIVE
Neisseria Gonorrhea: NEGATIVE
Wet Prep (BD Affirm): POSITIVE — AB

## 2016-09-04 ENCOUNTER — Other Ambulatory Visit: Payer: Self-pay | Admitting: Obstetrics and Gynecology

## 2016-09-04 DIAGNOSIS — N644 Mastodynia: Secondary | ICD-10-CM

## 2016-09-28 ENCOUNTER — Telehealth: Payer: Self-pay | Admitting: Cardiology

## 2016-09-28 NOTE — Telephone Encounter (Signed)
Received [facility] for appointment on 10/02/16 with Corine ShelterLuke Kilroy, PA.  Records put with Luke's schedule for 10/02/16. lp

## 2016-10-02 ENCOUNTER — Ambulatory Visit (INDEPENDENT_AMBULATORY_CARE_PROVIDER_SITE_OTHER): Payer: 59 | Admitting: Cardiology

## 2016-10-02 ENCOUNTER — Encounter: Payer: Self-pay | Admitting: Cardiology

## 2016-10-02 VITALS — BP 118/82 | HR 54 | Ht 60.0 in | Wt 227.8 lb

## 2016-10-02 DIAGNOSIS — M797 Fibromyalgia: Secondary | ICD-10-CM | POA: Diagnosis not present

## 2016-10-02 DIAGNOSIS — M3500 Sicca syndrome, unspecified: Secondary | ICD-10-CM

## 2016-10-02 DIAGNOSIS — Z8249 Family history of ischemic heart disease and other diseases of the circulatory system: Secondary | ICD-10-CM

## 2016-10-02 DIAGNOSIS — R002 Palpitations: Secondary | ICD-10-CM | POA: Insufficient documentation

## 2016-10-02 NOTE — Assessment & Plan Note (Signed)
Pt's brother has NICM diagnosed in his 720's- s/p ICD

## 2016-10-02 NOTE — Assessment & Plan Note (Signed)
Recent diagnosis per Rheumatologist

## 2016-10-02 NOTE — Assessment & Plan Note (Signed)
Pt seen today as a referral for palpitations ands a family history of cardiomyopathy

## 2016-10-02 NOTE — Progress Notes (Signed)
10/02/2016 Julia Fields   Mar 07, 1986  [id number]  Primary Physician Patient, No Pcp Per Primary Cardiologist: Dr Allyson Sabal (new)  HPI:  31 y/o obese AA female seen in the office today for evaluation of palpitations. Her brother is followed by our group with a history of NICM in his 77's. He has an ICD. Her mother also has "CHF" but I reviewed her records and she appears to have HTN and diastolic CHF. The pt has recently been diagnosed as having Sjogren's Syndrome and fibromyalgia. She has seen a rheumatologist and was placed on Plaquenil but it gave her diarrhea and she stopped it. The pt complains of rapid fluttering" that lasts up to 10 seconds. This is associated with SOB and weakness. She denies any syncope or near syncope. She denies any orthopnea or unusual DOE.    Current Outpatient Prescriptions  Medication Sig Dispense Refill  . Multiple Vitamin (MULTIVITAMIN) tablet Take 1 tablet by mouth daily.    . Vitamin D, Ergocalciferol, (DRISDOL) 50000 units CAPS capsule Take 1 capsule by mouth once a week.  0   No current facility-administered medications for this visit.     No Known Allergies  Past Medical History:  Diagnosis Date  . Sjogren's disease [facility])     Social History   Social History  . Marital status: Married    Spouse name: N/A  . Number of children: N/A  . Years of education: N/A   Occupational History  . Not on file.   Social History Main Topics  . Smoking status: Never Smoker  . Smokeless tobacco: Never Used  . Alcohol use Yes  . Drug use: No  . Sexual activity: Not on file   Other Topics Concern  . Not on file   Social History Narrative  . No narrative on file     Family History  Problem Relation Age of Onset  . Heart disease Mother   . Hypertension Mother   . Other Mother   . Heart disease Brother   . Hypertension Brother   . Other Brother      Review of Systems: General: negative for chills, fever, night sweats or weight changes.    Cardiovascular: negative for chest pain, dyspnea on exertion, edema, orthopnea, palpitations, paroxysmal nocturnal dyspnea or shortness of breath Dermatological: negative for rash Respiratory: negative for cough or wheezing Urologic: negative for hematuria Abdominal: negative for nausea, vomiting, diarrhea, bright red blood per rectum, melena, or hematemesis Neurologic: negative for visual changes, syncope, or dizziness All other systems reviewed and are otherwise negative except as noted above.    Blood pressure 118/82, pulse (!) 54, height 5' (1.524 m), weight 227 lb 12.8 oz (103.3 kg).  General appearance: alert, cooperative, no distress and moderately obese Neck: no carotid bruit and no JVD Lungs: clear to auscultation bilaterally Heart: regular rate and rhythm Abdomen: obese, non tender Extremities: extremities normal, atraumatic, no cyanosis or edema Pulses: 2+ and symmetric Skin: Skin color, texture, turgor normal. No rashes or lesions Neurologic: Grossly normal  EKG NSR, SB-54  ASSESSMENT AND PLAN:   Palpitations Pt seen today as a referral for palpitations ands a family history of cardiomyopathy  Family history of cardiomyopathy Pt's brother has NICM diagnosed in his 64's- s/p ICD  Fibromyalgia Recent diagnosis per Rheumatologist   Sjogren's disease (HCC) .   PLAN  I suggested we proceed with an echo and a 7 day Holter. She was told her TSH was WNL. F/U with a cardiologist after the above  tests.   Corine ShelterLuke Wildon Cuevas PA-C 10/02/2016 11:02 AM

## 2016-10-02 NOTE — Patient Instructions (Signed)
Medication Instructions:  Your physician recommends that you continue on your current medications as directed. Please refer to the Current Medication list given to you today.  Labwork: NONE  Testing/Procedures: Your physician has requested that you have an echocardiogram. Echocardiography is a painless test that uses sound waves to create images of your heart. It provides your doctor with information about the size and shape of your heart and how well your heart's chambers and valves are working. This procedure takes approximately one hour. There are no restrictions for this procedure.  Your physician has recommended that you wear a 7 day event monitor. Event monitors are medical devices that record the heart's electrical activity. Doctors most often us these monitors to diagnose arrhythmias. Arrhythmias are problems with the speed or rhythm of the heartbeat. The monitor is a small, portable device. You can wear one while you do your normal daily activities. This is usually used to diagnose what is causing palpitations/syncope (passing out).  -Both of [facility] test [facility]: [employer]  Follow-Up: Your physician recommends that you schedule a follow-up appointment: after testing with Dr. Allyson SabalBerry.   Any Other Special Instructions Will Be Listed Below (If Applicable).     If you need a refill on your cardiac medications before your next appointment, please call your pharmacy.

## 2016-10-13 ENCOUNTER — Ambulatory Visit (HOSPITAL_COMMUNITY): Payer: 59 | Attending: Cardiology

## 2016-10-13 ENCOUNTER — Other Ambulatory Visit: Payer: Self-pay

## 2016-10-13 ENCOUNTER — Ambulatory Visit (INDEPENDENT_AMBULATORY_CARE_PROVIDER_SITE_OTHER): Payer: 59

## 2016-10-13 DIAGNOSIS — Z8249 Family history of ischemic heart disease and other diseases of the circulatory system: Secondary | ICD-10-CM | POA: Diagnosis not present

## 2016-10-13 DIAGNOSIS — R002 Palpitations: Secondary | ICD-10-CM | POA: Diagnosis not present

## 2016-10-28 ENCOUNTER — Encounter: Payer: Self-pay | Admitting: Cardiovascular Disease

## 2016-10-28 ENCOUNTER — Ambulatory Visit (INDEPENDENT_AMBULATORY_CARE_PROVIDER_SITE_OTHER): Payer: 59 | Admitting: Cardiovascular Disease

## 2016-10-28 VITALS — BP 108/70 | HR 59 | Ht 61.0 in | Wt 230.2 lb

## 2016-10-28 DIAGNOSIS — R001 Bradycardia, unspecified: Secondary | ICD-10-CM | POA: Insufficient documentation

## 2016-10-28 NOTE — Progress Notes (Signed)
     10/28/2016 Julia Fields   04/11/1985  [id number]  Primary Physician Patient, No Pcp Per Primary Cardiologist: Runell GessJonathan J Endia Moncur MD Nicholes CalamityFACP, FACC, FAHA, [place]  HPI:  Julia Fields is a 31 y.o. female mother of 2 children accompanied by her boyfriend Alois ClicheO'Brien. She [facility] Cor[facility]Luke Kilroy [facility] in the office 10/02/16 after having being referred to him by her OB/GYN for palpitations and dizziness. Her history is also remarkable for a recent diagnosis of Sjogren's syndrome and fibromyalgia. She does have a family history of nonischemic cardiomyopathy the brother who has an ICD. She had a 2-D echo here that were revealed normal LV function with normal valvular function. A 2 week event monitor showed sinus bradycardia done the 40s. She does complain of dizziness. She is on no rate slowing drugs.   Current Meds  Medication Sig  . Multiple Vitamin (MULTIVITAMIN) tablet Take 1 tablet by mouth daily.  . Vitamin D, Ergocalciferol, (DRISDOL) 50000 units CAPS capsule Take 1 capsule by mouth once a week.     No Known Allergies  Social History   Social History  . Marital status: Married    Spouse name: N/A  . Number of children: N/A  . Years of education: N/A   Occupational History  . Not on file.   Social History Main Topics  . Smoking status: Never Smoker  . Smokeless tobacco: Never Used  . Alcohol use Yes  . Drug use: No  . Sexual activity: Not on file   Other Topics Concern  . Not on file   Social History Narrative  . No narrative on file     Review of Systems: General: negative for chills, fever, night sweats or weight changes.  Cardiovascular: negative for chest pain, dyspnea on exertion, edema, orthopnea, palpitations, paroxysmal nocturnal dyspnea or shortness of breath Dermatological: negative for rash Respiratory: negative for cough or wheezing Urologic: negative for hematuria Abdominal: negative for nausea, vomiting, diarrhea, bright red blood per rectum, melena, or  hematemesis Neurologic: negative for visual changes, syncope, or dizziness All other systems reviewed and are otherwise negative except as noted above.    Blood pressure 108/70, pulse (!) 59, height 5\' 1"  (1.549 m), weight 230 lb 3.2 oz (104.4 kg), SpO2 99 %.  General appearance: alert and no distress Neck: no adenopathy, no carotid bruit, no JVD, supple, symmetrical, trachea midline and thyroid not enlarged, symmetric, no tenderness/mass/nodules Lungs: clear to auscultation bilaterally Heart: regular rate and rhythm, S1, S2 normal, no murmur, click, rub or gallop Extremities: extremities normal, atraumatic, no cyanosis or edema  EKG not performed today  ASSESSMENT AND PLAN:   Slow heart rate Ms. Julia Fields returns today for follow-up of her outpatient tests including a 2-D echo which is entirely normal and to monitor that showed sinus tachycardia down in the 40s. She is symptomatic with dizziness. She complains of palpitations but these were not recognized on event monitoring. I am going to refer her to our electrophysiologists to further evaluate the possible need for permanent transvenous pacemaking.      Runell GessJonathan J. Jarvis Knodel MD FACP,FACC,FAHA, [facility] 10/28/2016 10:37 AM

## 2016-10-28 NOTE — Assessment & Plan Note (Signed)
Ms. Julia Fields returns today for follow-up of her outpatient tests including a 2-D echo which is entirely normal and to monitor that showed sinus tachycardia down in the 40s. She is symptomatic with dizziness. She complains of palpitations but these were not recognized on event monitoring. I am going to refer her to our electrophysiologists to further evaluate the possible need for permanent transvenous pacemaking.

## 2016-10-28 NOTE — Patient Instructions (Signed)
Medication Instructions: Your physician recommends that you continue on your current medications as directed. Please refer to the Current Medication list given to you today.   Follow-Up: You have been referred to Electrophysiology for Symptomatic Bradycardia.  Your physician wants you to follow-up in: 1 year with Dr. Allyson SabalBerry. You will receive a reminder letter in the mail two months in advance. If you don't receive a letter, please call our office to schedule the follow-up appointment.  If you need a refill on your cardiac medications before your next appointment, please call your pharmacy.

## 2016-11-18 ENCOUNTER — Ambulatory Visit
Admission: RE | Admit: 2016-11-18 | Discharge: 2016-11-18 | Disposition: A | Discharge status: Home / Self Care | Payer: 59 | Source: Ambulatory Visit | Attending: Obstetrics and Gynecology | Admitting: Obstetrics and Gynecology

## 2016-11-18 ENCOUNTER — Other Ambulatory Visit: Payer: Self-pay | Admitting: Obstetrics and Gynecology

## 2016-11-18 DIAGNOSIS — N644 Mastodynia: Secondary | ICD-10-CM

## 2016-11-19 ENCOUNTER — Encounter (HOSPITAL_COMMUNITY): Payer: Self-pay

## 2016-11-23 ENCOUNTER — Ambulatory Visit (INDEPENDENT_AMBULATORY_CARE_PROVIDER_SITE_OTHER): Payer: 59 | Admitting: Cardiology

## 2016-11-23 ENCOUNTER — Encounter: Payer: Self-pay | Admitting: Cardiology

## 2016-11-23 VITALS — BP 120/84 | HR 88 | Ht 61.0 in | Wt 236.6 lb

## 2016-11-23 DIAGNOSIS — R001 Bradycardia, unspecified: Secondary | ICD-10-CM | POA: Diagnosis not present

## 2016-11-23 NOTE — Patient Instructions (Signed)
Medication Instructions:  Your physician recommends that you continue on your current medications as directed. Please refer to the Current Medication list given to you today.  * If you need a refill on your cardiac medications before your next appointment, please call your pharmacy.   Labwork: None ordered  Testing/Procedures: None ordered  Follow-Up: No follow up is needed at this time with Dr. Camnitz.  He will see you on an as needed basis.   Thank you for choosing [facility]!!   Sherri Price, RN [phone]     

## 2016-11-23 NOTE — Progress Notes (Signed)
Electrophysiology Office Note   Date:  11/23/2016   ID:  [name], [name] 10-09-1985, MRN [id number]  PCP:  Patient, No Pcp Per  Cardiologist:  Allyson Sabal Primary Electrophysiologist:  Shamonica Schadt Jorja Loa, MD    Chief Complaint  Patient presents with  . Advice Only    Symptomatic Bradycardia  . Dizziness     History of Present Illness: Julia Fields is a 31 y.o. female who is being seen today for the evaluation of bradycardia at the request of Nanetta Batty. Presenting today for electrophysiology evaluation. She has a history of fibromyalgia, migraines, Sjogren's disease. She was referred by her OB/GYN Dr. Allyson Sabal for palpitations and dizziness. She has a family history of a nonischemic cardiomyopathy and a brother with an ICD. She wore a 30 day monitor that showed heart rates in the 40s. She does complain of dizziness. She is on no rate slowing medications. She does have fluttering that lasts up to 10 seconds at a time. She denies syncope or near syncope. No shortness of breath.    Today, she denies symptoms of palpitations, chest pain, shortness of breath, orthopnea, PND, lower extremity edema, claudication, dizziness, presyncope, syncope, bleeding, or neurologic sequela. The patient is tolerating medications without difficulties. He has not had symptoms of palpitations since her monitor was placed. She was recently diagnosed with rheumatoid arthritis, Sjogren's, and fibromyalgia. She feels that these could be causing a lot of her fatigue symptoms. She did wear a monitor, but has not had lightheadedness or syncope since then.   Past Medical History:  Diagnosis Date  . Bradycardia    [facility]  . Fibromyalgia   . Headache    Migraines  . Sjogren's disease (HCC)   . Vitamin D deficiency    Past Surgical History:  Procedure Laterality Date  . NECK SURGERY     cyst removed.  . TUBAL LIGATION       Current Outpatient Prescriptions  Medication Sig Dispense Refill  .  Multiple Vitamin (MULTIVITAMIN) tablet Take 1 tablet by mouth daily.    . predniSONE (DELTASONE) 5 MG tablet Take 5 mg by mouth. Take as dircted  0  . Vitamin D, Ergocalciferol, (DRISDOL) 50000 units CAPS capsule Take 1 capsule by mouth once a week.  0   No current facility-administered medications for this visit.     Allergies:   Patient has no known allergies.   Social History:  The patient  reports that she has never smoked. She has never used smokeless tobacco. She reports that she drinks alcohol. She reports that she does not use drugs.   Family History:  The patient's family history includes Heart disease in her brother and mother; Hypertension in her brother and mother; Other in her brother and mother.    ROS:  Please see the history of present illness.   Otherwise, review of systems is positive for weight change, fatigue, chest pain, leg swelling, palpitations, dyspnea on exertion, constipation, back pain, muscle pain, joint swelling, balance problems, dizziness, headaches.   All other systems are reviewed and negative.    PHYSICAL EXAM: VS:  BP 120/84   Pulse 88   Ht 5\' 1"  (1.549 m)   Wt 236 lb 9.6 oz (107.3 kg)   LMP 11/11/2016   BMI 44.71 kg/m  , BMI Body mass index is 44.71 kg/m. GEN: Well nourished, well developed, in no acute distress  HEENT: normal  Neck: no JVD, carotid bruits, or masses Cardiac: RRR; no murmurs, rubs, or gallops,no  edema  Respiratory:  clear to auscultation bilaterally, normal work of breathing GI: soft, nontender, nondistended, + BS MS: no deformity or atrophy  Skin: warm and dry Neuro:  Strength and sensation are intact Psych: euthymic mood, full affect  EKG:  EKG is ordered today. Personal review of the ekg ordered shows SR, rate 54  Recent Labs: No results found for requested labs within last 8760 hours.    Lipid Panel  No results found for: CHOL, TRIG, HDL, CHOLHDL, VLDL, LDLCALC, LDLDIRECT   Wt Readings from Last 3 Encounters:    11/23/16 236 lb 9.6 oz (107.3 kg)  10/28/16 230 lb 3.2 oz (104.4 kg)  10/02/16 227 lb 12.8 oz (103.3 kg)      Other studies Reviewed: Additional studies/ records that were reviewed today include: TTE 10/13/16  Review of the above records today demonstrates:  - Left ventricle: The cavity size was normal. Systolic function was   normal. The estimated ejection fraction was in the range of 60%   to 65%. Wall motion was normal; there were no regional wall   motion abnormalities. Left ventricular diastolic function   parameters were normal. - Aortic valve: Trileaflet; normal thickness leaflets. There was no   regurgitation. - Mitral valve: There was trivial regurgitation. - Right ventricle: Systolic function was normal. - Right atrium: The atrium was normal in size. - Tricuspid valve: There was trivial regurgitation. - Pulmonic valve: There was no regurgitation. - Pulmonary arteries: Systolic pressure was within the normal   range. - Pericardium, extracardiac: There was no pericardial effusion.  Monitor 11/10/16 - personally reviewed SR/ ST/ SB (40s)   ASSESSMENT AND PLAN:  1.  Sinus bradycardia: Early in sinus rhythm with heart rates in the 80s. She is feeling well with her only complaint of fatigue. Her monitor did show sinus bradycardia into the 40s at times. I did discuss with her the option of a pacemaker, though with short episodes of bradycardia into the 40s, I am not certain that this [name] improve her symptoms of lightheadedness and fatigue. I feel that her symptoms are likely due to her recent diagnosis of rheumatoid arthritis, fibromyalgia, and Sjogren's. She [name] call back if she has further symptoms.    Current medicines are reviewed at length with the patient today.   The patient does not have concerns regarding her medicines.  The following changes were made today:  none  Labs/ tests ordered today include:  No orders of the defined types were placed in this  encounter.    Disposition:   FU with Johnathon Olden PRN  Signed, Kayana Thoen Jorja Loa, MD  11/23/2016 2:07 PM     [facility] [address] [place] [address] [phone] (office) [phone] (fax)

## 2016-12-03 ENCOUNTER — Ambulatory Visit (HOSPITAL_COMMUNITY): Payer: 59 | Admitting: Anesthesiology

## 2016-12-03 ENCOUNTER — Encounter (HOSPITAL_COMMUNITY): Payer: Self-pay | Admitting: Emergency Medicine

## 2016-12-03 ENCOUNTER — Encounter (HOSPITAL_COMMUNITY)
Admission: RE | Disposition: A | Discharge status: Home / Self Care | Payer: Self-pay | Source: Ambulatory Visit | Attending: Obstetrics and Gynecology

## 2016-12-03 ENCOUNTER — Ambulatory Visit (HOSPITAL_COMMUNITY)
Admission: RE | Admit: 2016-12-03 | Discharge: 2016-12-03 | Disposition: A | Discharge status: Home / Self Care | Payer: 59 | Source: Ambulatory Visit | Attending: Obstetrics and Gynecology | Admitting: Obstetrics and Gynecology

## 2016-12-03 DIAGNOSIS — Z6841 Body Mass Index (BMI) 40.0 and over, adult: Secondary | ICD-10-CM | POA: Insufficient documentation

## 2016-12-03 DIAGNOSIS — E559 Vitamin D deficiency, unspecified: Secondary | ICD-10-CM | POA: Diagnosis not present

## 2016-12-03 DIAGNOSIS — M3500 Sicca syndrome, unspecified: Secondary | ICD-10-CM | POA: Insufficient documentation

## 2016-12-03 DIAGNOSIS — R001 Bradycardia, unspecified: Secondary | ICD-10-CM | POA: Insufficient documentation

## 2016-12-03 DIAGNOSIS — Z79899 Other long term (current) drug therapy: Secondary | ICD-10-CM | POA: Diagnosis not present

## 2016-12-03 DIAGNOSIS — M797 Fibromyalgia: Secondary | ICD-10-CM | POA: Diagnosis not present

## 2016-12-03 DIAGNOSIS — N840 Polyp of corpus uteri: Secondary | ICD-10-CM | POA: Insufficient documentation

## 2016-12-03 DIAGNOSIS — N920 Excessive and frequent menstruation with regular cycle: Secondary | ICD-10-CM | POA: Diagnosis present

## 2016-12-03 DIAGNOSIS — Z8249 Family history of ischemic heart disease and other diseases of the circulatory system: Secondary | ICD-10-CM | POA: Insufficient documentation

## 2016-12-03 HISTORY — DX: Vitamin D deficiency, unspecified: E55.9

## 2016-12-03 HISTORY — DX: Bradycardia, unspecified: R00.1

## 2016-12-03 HISTORY — DX: Headache, unspecified: R51.9

## 2016-12-03 HISTORY — PX: DILATATION & CURETTAGE/HYSTEROSCOPY WITH MYOSURE: SHX6511

## 2016-12-03 HISTORY — DX: Fibromyalgia: M79.7

## 2016-12-03 HISTORY — DX: Headache: R51

## 2016-12-03 LAB — CBC
HCT: 41 % (ref 36.0–46.0)
Hemoglobin: 13.6 g/dL (ref 12.0–15.0)
MCH: 25.8 pg — AB (ref 26.0–34.0)
MCHC: 33.2 g/dL (ref 30.0–36.0)
MCV: 77.7 fL — ABNORMAL LOW (ref 78.0–100.0)
PLATELETS: 268 10*3/uL (ref 150–400)
RBC: 5.28 MIL/uL — ABNORMAL HIGH (ref 3.87–5.11)
RDW: 14.5 % (ref 11.5–15.5)
WBC: 16.9 10*3/uL — ABNORMAL HIGH (ref 4.0–10.5)

## 2016-12-03 SURGERY — DILATATION & CURETTAGE/HYSTEROSCOPY WITH MYOSURE
Anesthesia: General | Site: Vagina

## 2016-12-03 MED ORDER — KETOROLAC TROMETHAMINE 30 MG/ML IJ SOLN
INTRAMUSCULAR | Status: DC | PRN
  Administered 2016-12-03: 30 mg via INTRAMUSCULAR
  Administered 2016-12-03: 30 mg via INTRAVENOUS

## 2016-12-03 MED ORDER — MIDAZOLAM HCL 2 MG/2ML IJ SOLN
INTRAMUSCULAR | Status: AC
  Filled 2016-12-03: qty 2

## 2016-12-03 MED ORDER — SCOPOLAMINE 1 MG/3DAYS TD PT72
1.0000 | MEDICATED_PATCH | Freq: Once | TRANSDERMAL | Status: DC
  Administered 2016-12-03: 1.5 mg via TRANSDERMAL

## 2016-12-03 MED ORDER — DEXAMETHASONE SODIUM PHOSPHATE 4 MG/ML IJ SOLN
INTRAMUSCULAR | Status: AC
  Filled 2016-12-03: qty 1

## 2016-12-03 MED ORDER — MEPERIDINE HCL 25 MG/ML IJ SOLN: 6.2500 mg | INTRAMUSCULAR | Status: DC | PRN

## 2016-12-03 MED ORDER — ACETAMINOPHEN 325 MG PO TABS: 325.0000 mg | ORAL_TABLET | ORAL | Status: DC | PRN

## 2016-12-03 MED ORDER — ONDANSETRON HCL 4 MG/2ML IJ SOLN: 4.0000 mg | Freq: Once | INTRAMUSCULAR | Status: DC | PRN

## 2016-12-03 MED ORDER — FENTANYL CITRATE (PF) 250 MCG/5ML IJ SOLN
INTRAMUSCULAR | Status: AC
  Filled 2016-12-03: qty 5

## 2016-12-03 MED ORDER — MIDAZOLAM HCL 2 MG/2ML IJ SOLN
INTRAMUSCULAR | Status: DC | PRN
  Administered 2016-12-03: 2 mg via INTRAVENOUS

## 2016-12-03 MED ORDER — DEXAMETHASONE SODIUM PHOSPHATE 10 MG/ML IJ SOLN
INTRAMUSCULAR | Status: DC | PRN
  Administered 2016-12-03: 10 mg via INTRAVENOUS

## 2016-12-03 MED ORDER — SODIUM CHLORIDE 0.9 % IR SOLN
Status: DC | PRN
  Administered 2016-12-03: 3000 mL

## 2016-12-03 MED ORDER — IBUPROFEN 800 MG PO TABS: 800.0000 mg | ORAL_TABLET | Freq: Three times a day (TID) | ORAL | 0 refills | Status: DC | PRN

## 2016-12-03 MED ORDER — ONDANSETRON HCL 4 MG/2ML IJ SOLN
INTRAMUSCULAR | Status: DC | PRN
  Administered 2016-12-03: 4 mg via INTRAVENOUS

## 2016-12-03 MED ORDER — PROPOFOL 10 MG/ML IV BOLUS
INTRAVENOUS | Status: DC | PRN
  Administered 2016-12-03: 170 mg via INTRAVENOUS

## 2016-12-03 MED ORDER — OXYCODONE HCL 5 MG PO TABS
5.0000 mg | ORAL_TABLET | Freq: Once | ORAL | Status: AC | PRN
  Administered 2016-12-03: 5 mg via ORAL

## 2016-12-03 MED ORDER — FENTANYL CITRATE (PF) 100 MCG/2ML IJ SOLN
25.0000 ug | INTRAMUSCULAR | Status: DC | PRN
  Administered 2016-12-03: 50 ug via INTRAVENOUS

## 2016-12-03 MED ORDER — LIDOCAINE HCL (CARDIAC) 20 MG/ML IV SOLN
INTRAVENOUS | Status: AC
  Filled 2016-12-03: qty 5

## 2016-12-03 MED ORDER — LIDOCAINE HCL (CARDIAC) 20 MG/ML IV SOLN
INTRAVENOUS | Status: DC | PRN
  Administered 2016-12-03: 60 mg via INTRAVENOUS

## 2016-12-03 MED ORDER — FENTANYL CITRATE (PF) 100 MCG/2ML IJ SOLN
INTRAMUSCULAR | Status: AC
  Filled 2016-12-03: qty 2

## 2016-12-03 MED ORDER — ONDANSETRON HCL 4 MG/2ML IJ SOLN
INTRAMUSCULAR | Status: AC
Start: 2016-12-03 — End: 2016-12-03
  Filled 2016-12-03: qty 2

## 2016-12-03 MED ORDER — LACTATED RINGERS IV SOLN
INTRAVENOUS | Status: DC
  Administered 2016-12-03 (×2): via INTRAVENOUS

## 2016-12-03 MED ORDER — KETOROLAC TROMETHAMINE 30 MG/ML IJ SOLN
INTRAMUSCULAR | Status: AC
  Filled 2016-12-03: qty 2

## 2016-12-03 MED ORDER — OXYCODONE HCL 5 MG PO TABS: 5.0000 mg | ORAL_TABLET | Freq: Four times a day (QID) | ORAL | 0 refills | Status: DC | PRN

## 2016-12-03 MED ORDER — FENTANYL CITRATE (PF) 100 MCG/2ML IJ SOLN
INTRAMUSCULAR | Status: DC | PRN
  Administered 2016-12-03: 100 ug via INTRAVENOUS

## 2016-12-03 MED ORDER — KETOROLAC TROMETHAMINE 30 MG/ML IJ SOLN: 30.0000 mg | Freq: Once | INTRAMUSCULAR | Status: DC | PRN

## 2016-12-03 MED ORDER — PROPOFOL 10 MG/ML IV BOLUS
INTRAVENOUS | Status: AC
  Filled 2016-12-03: qty 20

## 2016-12-03 MED ORDER — OXYCODONE HCL 5 MG/5ML PO SOLN: 5.0000 mg | Freq: Once | ORAL | Status: AC | PRN

## 2016-12-03 MED ORDER — SCOPOLAMINE 1 MG/3DAYS TD PT72
MEDICATED_PATCH | TRANSDERMAL | Status: AC
  Administered 2016-12-03: 1.5 mg via TRANSDERMAL
  Filled 2016-12-03: qty 1

## 2016-12-03 MED ORDER — ACETAMINOPHEN 160 MG/5ML PO SOLN: 325.0000 mg | ORAL | Status: DC | PRN

## 2016-12-03 MED ORDER — OXYCODONE HCL 5 MG PO TABS
ORAL_TABLET | ORAL | Status: AC
  Filled 2016-12-03: qty 1

## 2016-12-03 SURGICAL SUPPLY — 17 items
CANISTER SUCT 3000ML PPV (MISCELLANEOUS) ×3 IMPLANT
CATH ROBINSON RED A/P 16FR (CATHETERS) ×3 IMPLANT
CLOTH BEACON ORANGE TIMEOUT ST (SAFETY) ×3 IMPLANT
CONTAINER PREFILL 10% NBF 60ML (FORM) ×6 IMPLANT
DEVICE MYOSURE LITE (MISCELLANEOUS) ×3 IMPLANT
DEVICE MYOSURE REACH (MISCELLANEOUS) IMPLANT
FILTER ARTHROSCOPY CONVERTOR (FILTER) ×3 IMPLANT
GLOVE BIOGEL PI IND STRL 7.0 (GLOVE) ×2 IMPLANT
GLOVE BIOGEL PI INDICATOR 7.0 (GLOVE) ×4
GLOVE ECLIPSE 6.5 STRL STRAW (GLOVE) ×3 IMPLANT
GOWN STRL REUS W/TWL LRG LVL3 (GOWN DISPOSABLE) ×6 IMPLANT
PACK VAGINAL MINOR WOMEN LF (CUSTOM PROCEDURE TRAY) ×3 IMPLANT
PAD OB MATERNITY 4.3X12.25 (PERSONAL CARE ITEMS) ×3 IMPLANT
SEAL ROD LENS SCOPE MYOSURE (ABLATOR) ×3 IMPLANT
TOWEL OR 17X24 6PK STRL BLUE (TOWEL DISPOSABLE) ×6 IMPLANT
TUBING AQUILEX INFLOW (TUBING) ×3 IMPLANT
TUBING AQUILEX OUTFLOW (TUBING) ×3 IMPLANT

## 2016-12-03 NOTE — Discharge Instructions (Signed)
CALL  IF TEMP>100.4, NOTHING PER VAGINA X  1WK, CALL IF SOAKING A MAXI  PAD EVERY HOUR OR MORE FREQUENTLY  DISCHARGE INSTRUCTIONS: HYSTEROSCOPY / ENDOMETRIAL ABLATION The following instructions have been prepared to help you care for yourself upon your return home.  May Remove Scop patch on or before: Sunday 9/9  May take Ibuprofen after: 4:45 pm today  May take stool softner while taking narcotic pain medication to prevent constipation.  Drink plenty of water.  Personal hygiene:  Use sanitary pads for vaginal drainage, not tampons.  Shower the day after your procedure.  NO tub baths, pools or Jacuzzis for 2-3 weeks.  Wipe front to back after using the bathroom.  Activity and limitations:  Do NOT drive or operate any equipment for 24 hours. The effects of anesthesia are still present and drowsiness may result.  Do NOT rest in bed all day.  Walking is encouraged.  Walk up and down stairs slowly.  You may resume your normal activity in one to two days or as indicated by your physician. Sexual activity: NO intercourse for at least 2 weeks after the procedure, or as indicated by your Doctor.  Diet: Eat a light meal as desired this evening. You may resume your usual diet tomorrow.  Return to Work: You may resume your work activities in one to two days or as indicated by Therapist, sportsyour Doctor.  What to expect after your surgery: Expect to have vaginal bleeding/discharge for 2-3 days and spotting for up to 10 days. It is not unusual to have soreness for up to 1-2 weeks. You may have a slight burning sensation when you urinate for the first day. Mild cramps may continue for a couple of days. You may have a regular period in 2-6 weeks.  Call your doctor for any of the following:  Excessive vaginal bleeding or clotting, saturating and changing one pad every hour.  Inability to urinate 6 hours after discharge from hospital.  Pain not relieved by pain medication.  Fever of 100.4 F or  greater.  Unusual vaginal discharge or odor.  Return to office _________________Call for an appointment ___________________ Patients signature: ______________________ Nurses signature ________________________  Post Anesthesia Care Unit [phone]   Post Anesthesia Home Care Instructions  Activity: Get plenty of rest for the remainder of the day. A responsible individual must stay with you for 24 hours following the procedure.  For the next 24 hours, DO NOT: -Drive a car -Advertising copywriterperate machinery -Drink alcoholic beverages -Take any medication unless instructed by your physician -Make any legal decisions or sign important papers.  Meals: Start with liquid foods such as gelatin or soup. Progress to regular foods as tolerated. Avoid greasy, spicy, heavy foods. If nausea and/or vomiting occur, drink only clear liquids until the nausea and/or vomiting subsides. Call your physician if vomiting continues.  Special Instructions/Symptoms: Your throat may feel dry or sore from the anesthesia or the breathing tube placed in your throat during surgery. If this causes discomfort, gargle with warm salt water. The discomfort should disappear within 24 hours.  If you had a scopolamine patch placed behind your ear for the management of post- operative nausea and/or vomiting:  1. The medication in the patch is effective for 72 hours, after which it should be removed.  Wrap patch in a tissue and discard in the trash. Wash hands thoroughly with soap and water. 2. You may remove the patch earlier than 72 hours if you experience unpleasant side effects which may include dry  mouth, dizziness or visual disturbances. 3. Avoid touching the patch. Wash your hands with soap and water after contact with the patch.

## 2016-12-03 NOTE — Anesthesia Postprocedure Evaluation (Signed)
Anesthesia Post Note  Patient: Julia Fields  Procedure(s) Performed: Procedure(s) (LRB): DILATATION & CURETTAGE/HYSTEROSCOPY WITH MYOSURE (N/A)     Patient location during evaluation: PACU Anesthesia Type: General Level of consciousness: awake Pain management: pain level controlled Vital Signs Assessment: post-procedure vital signs reviewed and stable Respiratory status: spontaneous breathing Cardiovascular status: stable Postop Assessment: no signs of nausea or vomiting Anesthetic complications: no    Last Vitals:  Vitals:   12/03/16 1215 12/03/16 1308  BP: 108/69 (!) 146/78  Pulse: (!) 54 62  Resp: 17 18  Temp:  36.5 C  SpO2: 100% 100%    Last Pain:  Vitals:   12/03/16 1308  PainSc: 7    Pain Goal: Patients Stated Pain Goal: 6 (12/03/16 1308)               Mckoy Bhakta JR,JOHN Susann GivensFRANKLIN

## 2016-12-03 NOTE — Transfer of Care (Signed)
Immediate Anesthesia Transfer of Care Note  Patient: Julia SailsJerica A Penrose  Procedure(s) Performed: Procedure(s): DILATATION & CURETTAGE/HYSTEROSCOPY WITH MYOSURE (N/A)  Patient Location: PACU  Anesthesia Type:General  Level of Consciousness: sedated  Airway & Oxygen Therapy: Patient Spontanous Breathing and Patient connected to nasal cannula oxygen  Post-op Assessment: Report given to RN  Post vital signs: Reviewed and stable  Last Vitals:  Vitals:   12/03/16 0921  BP: 137/89  Pulse: 68  Resp: 15  Temp: 36.7 C  SpO2: 99%    Last Pain: There were no vitals filed for this visit.    Patients Stated Pain Goal: 6 (12/03/16 [id number])  Complications: No apparent anesthesia complications

## 2016-12-03 NOTE — H&P (Signed)
Julia Fields is an 31 y.o. female [phone] presents for dx hysteroscopy, D&C resection of endometrial polyp seen on sonohysterogram due to endometrial thickening on sonogram done for menorrhagia. Hx essure TL  Pertinent Gynecological History: Menses: flow is excessive with use of 4-5 pads or tampons on heaviest days Bleeding: heavy Contraception: tubal ligation DES exposure: denies Blood transfusions: none Sexually transmitted diseases: no past history Previous GYN Procedures: DNC  Last mammogram: n/Fields Date: n/Fields Last pap: normal Date:2018 OB History: G4P2   Menstrual History: Menarche age: n/Fields Patient's last menstrual period was 11/11/2016.    Past Medical History:  Diagnosis Date  . Bradycardia    [facility]  . Fibromyalgia   . Headache    Migraines  . Sjogren's disease (HCC)   . Vitamin D deficiency     Past Surgical History:  Procedure Laterality Date  . NECK SURGERY     cyst removed.  . TUBAL LIGATION      Family History  Problem Relation Age of Onset  . Heart disease Mother   . Hypertension Mother   . Other Mother   . Heart disease Brother   . Hypertension Brother   . Other Brother     Social History:  reports that she has never smoked. She has never used smokeless tobacco. She reports that she drinks alcohol. She reports that she does not use drugs.  Allergies: No Known Allergies  Prescriptions Prior to Admission  Medication Sig Dispense Refill Last Dose  . Multiple Vitamin (MULTIVITAMIN) tablet Take 1 tablet by mouth daily.   Taking  . predniSONE (DELTASONE) 5 MG tablet Take 5-20 mg by mouth See admin instructions. Take 20 mg by mouth daily for 5 days, take 15 mg by mouth daily for 5 days, take 10 mg by mouth daily for 5 days, then take 5 mg by mouth daily for 5 days  0 Taking  . Vitamin D, Ergocalciferol, (DRISDOL) 50000 units CAPS capsule Take 50,000 Units by mouth every Tuesday.   0 Taking    Review of Systems  All other systems reviewed and  are negative.   Last menstrual period 11/11/2016. Physical Exam  Constitutional: She is oriented to person, place, and time. She appears well-developed and well-nourished.  HENT:  Head: Atraumatic.  Eyes: EOM are normal.  Neck: Neck supple.  Cardiovascular: Normal rate and regular rhythm.   Respiratory: Breath sounds normal.  GI: Soft.  Genitourinary: Vagina normal and uterus normal.  Genitourinary Comments: Cervix nl Adnexa nl   Musculoskeletal: She exhibits edema.  Neurological: She is alert and oriented to person, place, and time.  Skin: Skin is warm and dry.  Psychiatric: She has Fields normal mood and affect.    No results found for this or any previous visit (from the past 24 hour(s)).  No results found.  Assessment/Plan: Menorrhagia with regular cycles Endometrial polyp P) dx hysteroscopy, resection of endometrial polyp, D&C.  Risk of surgery includes infection, bleeding, injury to surrounding organ structures, internal scar tissue, uterine perforation ( 03/998) and its risk, thermal injury, fluid overload and its mgmt. ALL ? answered  Julia Fields 12/03/2016, 7:57 AM

## 2016-12-03 NOTE — Anesthesia Procedure Notes (Signed)
Procedure Name: LMA Insertion Date/Time: 12/03/2016 10:34 AM Performed by: Cephus ShellingBURGER, Julia Fields Pre-anesthesia Checklist: Patient being monitored, Patient identified, Emergency Drugs available and Suction available Patient Re-evaluated:Patient Re-evaluated prior to induction Oxygen Delivery Method: Circle system utilized Preoxygenation: Pre-oxygenation with 100% oxygen Induction Type: IV induction and Inhalational induction Ventilation: Mask ventilation without difficulty LMA: LMA inserted LMA Size: 4.0 Number of attempts: 1 Dental Injury: Teeth and Oropharynx as per pre-operative assessment

## 2016-12-03 NOTE — Brief Op Note (Signed)
12/03/2016  11:30 AM  PATIENT:  Julia Fields  31 y.o. female  PRE-OPERATIVE DIAGNOSIS:  Menorrhagia with regular cycles, Endometrial Polyp  POST-OPERATIVE DIAGNOSIS:  Menorrhagia with regular cycles,  Endometrial Polyp  PROCEDURE:  Diagnostic hysteroscopy, hysteroscopic resection of endometrial polyp using myosure, dilation and curettage  SURGEON:  Surgeon(s) and Role:    * Maxie Betterousins, Chas Axel, MD - Primary  PHYSICIAN ASSISTANT:   ASSISTANTS: none   ANESTHESIA:   general Findings: tubal ostia seen . No essure coil noted. Posterior large endometrial polyp, no endocervical canal lesion EBL:  Total I/O In: 1000 [I.V.:1000] Out: 105 [Urine:100; Blood:5]  BLOOD ADMINISTERED:none  DRAINS: none   LOCAL MEDICATIONS USED:  NONE  SPECIMEN:  Source of Specimen:  emc with endometrial polyp  DISPOSITION OF SPECIMEN:  PATHOLOGY  COUNTS:  YES  TOURNIQUET:  * No tourniquets in log *  DICTATION: .Other Dictation: Dictation Number [id number]  PLAN OF CARE: Discharge to home after PACU  PATIENT DISPOSITION:  PACU - hemodynamically stable.   Delay start of Pharmacological VTE agent (>24hrs) due to surgical blood loss or risk of bleeding: n/a

## 2016-12-03 NOTE — Anesthesia Preprocedure Evaluation (Signed)
Anesthesia Evaluation  Patient identified by MRN, date of birth, ID band Patient awake    Reviewed: Allergy & Precautions, NPO status , Patient's Chart, lab work & pertinent test results  Airway Mallampati: II       Dental no notable dental hx. (+) Teeth Intact   Pulmonary neg pulmonary ROS,    Pulmonary exam normal breath sounds clear to auscultation       Cardiovascular negative cardio ROS Normal cardiovascular exam Rhythm:Regular Rate:Normal     Neuro/Psych negative psych ROS   GI/Hepatic negative GI ROS, Neg liver ROS,   Endo/Other  Morbid obesity  Renal/GU negative Renal ROS  negative genitourinary   Musculoskeletal negative musculoskeletal ROS (+)   Abdominal (+) + obese,   Peds  Hematology negative hematology ROS (+)   Anesthesia Other Findings   Reproductive/Obstetrics                             Anesthesia Physical Anesthesia Plan  ASA: III  Anesthesia Plan: General   Post-op Pain Management:    Induction: Intravenous  PONV Risk Score and Plan:   Airway Management Planned: LMA  Additional Equipment:   Intra-op Plan:   Post-operative Plan:   Informed Consent: I have reviewed the patients History and Physical, chart, labs and discussed the procedure including the risks, benefits and alternatives for the proposed anesthesia with the patient or authorized representative who has indicated his/her understanding and acceptance.     Plan Discussed with: CRNA and Surgeon  Anesthesia Plan Comments:         Anesthesia Quick Evaluation

## 2016-12-04 ENCOUNTER — Encounter (HOSPITAL_COMMUNITY): Payer: Self-pay | Admitting: Obstetrics and Gynecology

## 2016-12-04 NOTE — Op Note (Signed)
NAME:  Julia Fields, [name]                     ACCOUNT NO.:  MEDICAL RECORD NO.:  [id number]  LOCATION:                                 FACILITY:  PHYSICIAN:  Maxie BetterSheronette Jericka Kadar, M.D.    DATE OF BIRTH:  DATE OF PROCEDURE:  12/03/2016 DATE OF DISCHARGE:                              OPERATIVE REPORT   PREOPERATIVE DIAGNOSIS:  Menorrhagia with regular cycles, endometrial polyp.  PROCEDURES:  Diagnostic hysteroscopy, hysteroscopic resection of endometrial polyps using MyoSure, dilation and curettage.  POSTOPERATIVE DIAGNOSIS:  Menorrhagia with regular cycles, endometrial polyp.  ANESTHESIA:  General.  SURGEON:  Maxie BetterSheronette Roylene Heaton, M.D.  ASSISTANT:  None.  DESCRIPTION OF PROCEDURE:  Under adequate general anesthesia, the patient was placed in the dorsal lithotomy position.  She was sterilely prepped and draped in usual fashion.  Then, bladder was catheterized for moderate amount of urine.  Examination under anesthesia revealed anteverted uterus.  No adnexal masses could be appreciated.  Bivalve speculum was placed in the vagina.  Single-tooth tenaculum was placed on the anterior lip of the cervix.  Cervix was dilated up to #[facility] dilator.  A diagnostic hysteroscope was introduced into the uterine cavity.  Both tubal ostia could be seen.  The Essure apparatus was not noted.  Posterior endometrial polyp was noted.  The Lite MyoSure resectoscope was then inserted.  The polyp was resected in its entirety. No other lesions were noted.  Endocervical canal was inspected and the resectoscope was then removed.  The cavity was then gently curetted for a scant amount of tissue.  All instruments were then removed from the vagina.  SPECIMEN:  Labeled endometrial curetting with polyp was sent to Pathology.  ESTIMATED BLOOD LOSS:  5 mL.  FLUIDS:  1 L.  URINE OUTPUT:  100 mL.  COUNTS:  Sponge and instrument counts x2 were correct.  COMPLICATION:  None.  The patient tolerated the  procedure well, was transferred to the recovery room in stable condition.     Maxie BetterSheronette Eira Alpert, M.D.     [place]/MEDQ  D:  12/04/2016  T:  12/04/2016  Job:  [id number]

## 2017-11-20 ENCOUNTER — Ambulatory Visit (HOSPITAL_COMMUNITY)
Admission: EM | Admit: 2017-11-20 | Discharge: 2017-11-20 | Disposition: A | Discharge status: Home / Self Care | Payer: 59 | Attending: Internal Medicine | Admitting: Internal Medicine

## 2017-11-20 ENCOUNTER — Other Ambulatory Visit: Payer: Self-pay

## 2017-11-20 ENCOUNTER — Encounter (HOSPITAL_COMMUNITY): Payer: Self-pay

## 2017-11-20 DIAGNOSIS — Z7952 Long term (current) use of systemic steroids: Secondary | ICD-10-CM | POA: Insufficient documentation

## 2017-11-20 DIAGNOSIS — N898 Other specified noninflammatory disorders of vagina: Secondary | ICD-10-CM

## 2017-11-20 DIAGNOSIS — B379 Candidiasis, unspecified: Secondary | ICD-10-CM

## 2017-11-20 DIAGNOSIS — N760 Acute vaginitis: Secondary | ICD-10-CM | POA: Insufficient documentation

## 2017-11-20 DIAGNOSIS — Z79899 Other long term (current) drug therapy: Secondary | ICD-10-CM | POA: Insufficient documentation

## 2017-11-20 DIAGNOSIS — Z79891 Long term (current) use of opiate analgesic: Secondary | ICD-10-CM | POA: Insufficient documentation

## 2017-11-20 DIAGNOSIS — M797 Fibromyalgia: Secondary | ICD-10-CM | POA: Insufficient documentation

## 2017-11-20 DIAGNOSIS — M3500 Sicca syndrome, unspecified: Secondary | ICD-10-CM | POA: Insufficient documentation

## 2017-11-20 DIAGNOSIS — Z113 Encounter for screening for infections with a predominantly sexual mode of transmission: Secondary | ICD-10-CM

## 2017-11-20 DIAGNOSIS — Z9889 Other specified postprocedural states: Secondary | ICD-10-CM | POA: Insufficient documentation

## 2017-11-20 DIAGNOSIS — Z791 Long term (current) use of non-steroidal anti-inflammatories (NSAID): Secondary | ICD-10-CM | POA: Insufficient documentation

## 2017-11-20 DIAGNOSIS — Z8249 Family history of ischemic heart disease and other diseases of the circulatory system: Secondary | ICD-10-CM | POA: Insufficient documentation

## 2017-11-20 DIAGNOSIS — Z9851 Tubal ligation status: Secondary | ICD-10-CM | POA: Insufficient documentation

## 2017-11-20 DIAGNOSIS — E559 Vitamin D deficiency, unspecified: Secondary | ICD-10-CM | POA: Insufficient documentation

## 2017-11-20 MED ORDER — METRONIDAZOLE 500 MG PO TABS: 500.0000 mg | ORAL_TABLET | Freq: Two times a day (BID) | ORAL | 0 refills | Status: DC

## 2017-11-20 MED ORDER — MICONAZOLE NITRATE 1200 & 2 MG & % VA KIT: 1.0000 | PACK | Freq: Once | VAGINAL | 0 refills | Status: AC

## 2017-11-20 NOTE — ED Provider Notes (Signed)
[facility]    CSN: [id number] Arrival date & time: 11/20/17  1047     History   Chief Complaint Chief Complaint  Patient presents with  . Vaginitis    HPI Julia Fields is a 32 y.o. female.   32 y.o. female presents with vaginal discharge that is white and thick in nature with eryrthema X 1 week.Patient states that she was recently treated for an abscess and placed on antibiotics.patient denies any foul odor, increase in urination suprapubic pain or pain with urination. Patient states that she has previously had BV and candidiasis and is requesting treatment for both at this time. Patient would also like to be testing for STI at this time. Patient states that she is in a monongamous relationship  However is not sure he boyfriend is faithful when she is away at work. Condition is acute in nature. Condition is made better by nothing. Condition is made worse by nothing . Patient reports minimal relief with boric acid taken  prior to there arrival at this facility. Patient declines pelvic exam at this time.       Past Medical History:  Diagnosis Date  . Bradycardia    [facility]  . Fibromyalgia   . Headache    Migraines  . Sjogren's disease (HCC)   . Vitamin D deficiency     Patient Active Problem List   Diagnosis Date Noted  . Slow heart rate 10/28/2016  . Palpitations 10/02/2016  . Morbid obesity (HCC) 10/02/2016  . Fibromyalgia 10/02/2016  . Family history of cardiomyopathy 10/02/2016  . Sjogren's disease [facility](HCC)     Past Surgical History:  Procedure Laterality Date  . DILATATION & CURETTAGE/HYSTEROSCOPY WITH MYOSURE N/A 12/03/2016   Procedure: DILATATION & CURETTAGE/HYSTEROSCOPY WITH MYOSURE;  Surgeon: Maxie Betterousins, Sheronette, MD;  Location: [facility];  Service: Gynecology;  Laterality: N/A;  . NECK SURGERY     cyst removed.  . TUBAL LIGATION      OB History   None      Home Medications    Prior to Admission medications   Medication Sig Start  Date End Date Taking? Authorizing Provider  ibuprofen (ADVIL,MOTRIN) 800 MG tablet Take 1 tablet (800 mg total) by mouth every 8 (eight) hours as needed. 12/03/16   Maxie Betterousins, Sheronette, MD  Multiple Vitamin (MULTIVITAMIN) tablet Take 1 tablet by mouth daily.    [provider]  oxyCODONE (OXY IR/ROXICODONE) 5 MG immediate release tablet Take 1 tablet (5 mg total) by mouth every 6 (six) hours as needed (for pain score of 1-4). 12/03/16   Maxie Betterousins, Sheronette, MD  predniSONE (DELTASONE) 5 MG tablet Take 5-20 mg by mouth See admin instructions. Take 20 mg by mouth daily for 5 days, take 15 mg by mouth daily for 5 days, take 10 mg by mouth daily for 5 days, then take 5 mg by mouth daily for 5 days 11/19/16   [provider]  Vitamin D, Ergocalciferol, (DRISDOL) 50000 units CAPS capsule Take 50,000 Units by mouth every Tuesday.  09/08/16   [provider]    Family History Family History  Problem Relation Age of Onset  . Heart disease Mother   . Hypertension Mother   . Other Mother   . Heart disease Brother   . Hypertension Brother   . Other Brother     Social History Social History   Tobacco Use  . Smoking status: Never Smoker  . Smokeless tobacco: Never Used  Substance Use Topics  .  Alcohol use: Yes    Comment: socially  . Drug use: No     Allergies   Patient has no known allergies.   Review of Systems Review of Systems  Constitutional: Negative for chills and fever.  HENT: Negative for ear pain and sore throat.   Eyes: Negative for pain and visual disturbance.  Respiratory: Negative for cough and shortness of breath.   Cardiovascular: Negative for chest pain and palpitations.  Gastrointestinal: Negative for abdominal pain and vomiting.  Genitourinary: Positive for vaginal discharge ( white ). Negative for dysuria, hematuria, vaginal bleeding and vaginal pain.  Musculoskeletal: Negative for arthralgias and back pain.  Skin: Negative for color change and  rash.  Neurological: Negative for seizures and syncope.  All other systems reviewed and are negative.    Physical Exam Triage Vital Signs ED Triage Vitals  Enc Vitals Group     BP 11/20/17 1219 119/77     Pulse Rate 11/20/17 1219 65     Resp 11/20/17 1219 16     Temp 11/20/17 1219 97.7 F (36.5 C)     Temp Source 11/20/17 1219 Oral     SpO2 11/20/17 1219 100 %     Weight --      Height --      Head Circumference --      Peak Flow --      Pain Score 11/20/17 1221 0     Pain Loc --      Pain Edu? --      Excl. in GC? --    No data found.  Updated Vital Signs BP 119/77 (BP Location: Left Arm)   Pulse 65   Temp 97.7 F (36.5 C) (Oral)   Resp 16   LMP 10/28/2017 (Exact Date)   SpO2 100%   Visual Acuity Right Eye Distance:   Left Eye Distance:   Bilateral Distance:    Right Eye Near:   Left Eye Near:    Bilateral Near:     Physical Exam  Constitutional: She is oriented to person, place, and time. She appears well-developed and well-nourished.  HENT:  Head: Normocephalic and atraumatic.  Eyes: Conjunctivae are normal.  Neck: Normal range of motion.  Pulmonary/Chest: Effort normal.  Musculoskeletal: Normal range of motion.  Neurological: She is alert and oriented to person, place, and time.  Skin: Skin is warm.  Psychiatric: She has a normal mood and affect.  Nursing note and vitals reviewed.    UC Treatments / Results  Labs (all labs ordered are listed, but only abnormal results are displayed) Labs Reviewed - No data to display  EKG None  Radiology No results found.  Procedures Procedures (including critical care time)  Medications Ordered in UC Medications - No data to display  Initial Impression / Assessment and Plan / UC Course  I have reviewed the triage vital signs and the nursing notes.  Pertinent labs & imaging results that were available during my care of the patient were reviewed by me and considered in my medical decision making (see  chart for details).      Final Clinical Impressions(s) / UC Diagnoses   Final diagnoses:  None   Discharge Instructions   None    ED Prescriptions    None     Controlled Substance Prescriptions [place] Controlled Substance Registry consulted? Not Applicable   Alene Mires, NP 11/20/17 1316

## 2017-11-20 NOTE — ED Triage Notes (Signed)
[facility], pt states she was previously on antibiotics and thinks she may have developed an yeast infection and would like to be tested for STD

## 2017-11-22 LAB — URINE CYTOLOGY ANCILLARY ONLY
CHLAMYDIA, DNA PROBE: NEGATIVE
NEISSERIA GONORRHEA: NEGATIVE
Trichomonas: NEGATIVE

## 2017-11-24 LAB — URINE CYTOLOGY ANCILLARY ONLY
Bacterial vaginitis: NEGATIVE
Candida vaginitis: NEGATIVE

## 2018-05-15 ENCOUNTER — Emergency Department (HOSPITAL_BASED_OUTPATIENT_CLINIC_OR_DEPARTMENT_OTHER)
Admission: EM | Admit: 2018-05-15 | Discharge: 2018-05-16 | Disposition: A | Discharge status: Home / Self Care | Payer: 59 | Attending: Emergency Medicine | Admitting: Emergency Medicine

## 2018-05-15 ENCOUNTER — Encounter (HOSPITAL_BASED_OUTPATIENT_CLINIC_OR_DEPARTMENT_OTHER): Payer: Self-pay | Admitting: *Deleted

## 2018-05-15 ENCOUNTER — Other Ambulatory Visit: Payer: Self-pay

## 2018-05-15 DIAGNOSIS — Z79899 Other long term (current) drug therapy: Secondary | ICD-10-CM | POA: Insufficient documentation

## 2018-05-15 DIAGNOSIS — R05 Cough: Secondary | ICD-10-CM | POA: Diagnosis present

## 2018-05-15 DIAGNOSIS — J069 Acute upper respiratory infection, unspecified: Secondary | ICD-10-CM

## 2018-05-15 DIAGNOSIS — B9789 Other viral agents as the cause of diseases classified elsewhere: Secondary | ICD-10-CM | POA: Diagnosis not present

## 2018-05-15 NOTE — ED Triage Notes (Addendum)
Cough, congestion, subjective fever, sore throat x 1 week

## 2018-05-16 LAB — GROUP A STREP BY PCR: Group A Strep by PCR: NOT DETECTED

## 2018-05-16 MED ORDER — ALBUTEROL SULFATE HFA 108 (90 BASE) MCG/ACT IN AERS
2.0000 | INHALATION_SPRAY | RESPIRATORY_TRACT | Status: DC | PRN
  Administered 2018-05-16: 2 via RESPIRATORY_TRACT
  Filled 2018-05-16: qty 6.7

## 2018-05-16 MED ORDER — PREDNISONE 20 MG PO TABS: 40.0000 mg | ORAL_TABLET | Freq: Every day | ORAL | 0 refills | Status: DC

## 2018-05-16 MED ORDER — PREDNISONE 50 MG PO TABS
60.0000 mg | ORAL_TABLET | Freq: Once | ORAL | Status: AC
  Administered 2018-05-16: 01:00:00 60 mg via ORAL
  Filled 2018-05-16: qty 1

## 2018-05-16 MED ORDER — BENZONATATE 200 MG PO CAPS: 200.0000 mg | ORAL_CAPSULE | Freq: Three times a day (TID) | ORAL | 0 refills | Status: DC | PRN

## 2018-05-16 NOTE — ED Notes (Signed)
ED Provider at bedside. 

## 2018-05-16 NOTE — ED Provider Notes (Signed)
[facility] EMERGENCY DEPARTMENT Provider Note   CSN: [id number] Arrival date & time: 05/15/18  2324     History   Chief Complaint Chief Complaint  Patient presents with  . Cough    HPI Julia Fields is a 33 y.o. female.  Patient presents to the emergency department for evaluation of URI symptoms.  Patient has been sick for 1 week.  She started with cough and then felt intermittent fever.  She has had nasal congestion, chest congestion.  She has not had any nausea, vomiting or diarrhea.  Patient does notice swelling of her tonsils.     Past Medical History:  Diagnosis Date  . Bradycardia    [facility]  . Fibromyalgia   . Headache    Migraines  . Sjogren's disease (HCC)   . Vitamin D deficiency     Patient Active Problem List   Diagnosis Date Noted  . Slow heart rate 10/28/2016  . Palpitations 10/02/2016  . Morbid obesity (HCC) 10/02/2016  . Fibromyalgia 10/02/2016  . Family history of cardiomyopathy 10/02/2016  . Sjogren's disease [facility])     Past Surgical History:  Procedure Laterality Date  . DILATATION & CURETTAGE/HYSTEROSCOPY WITH MYOSURE N/A 12/03/2016   Procedure: DILATATION & CURETTAGE/HYSTEROSCOPY WITH MYOSURE;  Surgeon: Maxie Better, MD;  Location: [facility];  Service: Gynecology;  Laterality: N/A;  . NECK SURGERY     cyst removed.  . TUBAL LIGATION       OB History   No obstetric history on file.      Home Medications    Prior to Admission medications   Medication Sig Start Date End Date Taking? Authorizing Provider  benzonatate (TESSALON) 200 MG capsule Take 1 capsule (200 mg total) by mouth 3 (three) times daily as needed for cough. 05/16/18   Gilda Crease, MD  ibuprofen (ADVIL,MOTRIN) 800 MG tablet Take 1 tablet (800 mg total) by mouth every 8 (eight) hours as needed. 12/03/16   Maxie Better, MD  metroNIDAZOLE (FLAGYL) 500 MG tablet Take 1 tablet (500 mg total) by mouth 2 (two) times daily. 11/20/17    Alene Mires, NP  Multiple Vitamin (MULTIVITAMIN) tablet Take 1 tablet by mouth daily.    [provider]  oxyCODONE (OXY IR/ROXICODONE) 5 MG immediate release tablet Take 1 tablet (5 mg total) by mouth every 6 (six) hours as needed (for pain score of 1-4). 12/03/16   Maxie Better, MD  predniSONE (DELTASONE) 20 MG tablet Take 2 tablets (40 mg total) by mouth daily with breakfast. 05/16/18   Gilda Crease, MD  Vitamin D, Ergocalciferol, (DRISDOL) 50000 units CAPS capsule Take 50,000 Units by mouth every Tuesday.  09/08/16   [provider]    Family History Family History  Problem Relation Age of Onset  . Heart disease Mother   . Hypertension Mother   . Other Mother   . Heart disease Brother   . Hypertension Brother   . Other Brother     Social History Social History   Tobacco Use  . Smoking status: Never Smoker  . Smokeless tobacco: Never Used  Substance Use Topics  . Alcohol use: Not Currently    Comment: socially  . Drug use: No     Allergies   Patient has no known allergies.   Review of Systems Review of Systems  Constitutional: Positive for fever.  HENT: Positive for congestion and sore throat.   Respiratory: Positive for cough.   All other systems reviewed and  are negative.    Physical Exam Updated Vital Signs BP (!) 155/86 (BP Location: Left Wrist)   Pulse 77   Temp 98 F (36.7 C) (Oral)   Resp (!) 24   Ht 5\' 1"  (1.549 m)   Wt 113.4 kg   LMP 04/18/2018   SpO2 97%   BMI 47.24 kg/m   Physical Exam Vitals signs and nursing note reviewed.  Constitutional:      General: She is not in acute distress.    Appearance: Normal appearance. She is well-developed.  HENT:     Head: Normocephalic and atraumatic.     Right Ear: Hearing normal.     Left Ear: Hearing normal.     Nose: Nose normal.     Mouth/Throat:     Pharynx: Oropharyngeal exudate (R tonsil > L) present.  Eyes:     Conjunctiva/sclera: Conjunctivae  normal.     Pupils: Pupils are equal, round, and reactive to light.  Neck:     Musculoskeletal: Normal range of motion and neck supple.  Cardiovascular:     Rate and Rhythm: Regular rhythm.     Heart sounds: S1 normal and S2 normal. No murmur. No friction rub. No gallop.   Pulmonary:     Effort: Pulmonary effort is normal. No respiratory distress.     Breath sounds: Normal breath sounds.  Chest:     Chest wall: No tenderness.  Abdominal:     General: Bowel sounds are normal.     Palpations: Abdomen is soft.     Tenderness: There is no abdominal tenderness. There is no guarding or rebound. Negative signs include Murphy's sign and McBurney's sign.     Hernia: No hernia is present.  Musculoskeletal: Normal range of motion.  Skin:    General: Skin is warm and dry.     Findings: No rash.  Neurological:     Mental Status: She is alert and oriented to person, place, and time.     GCS: GCS eye subscore is 4. GCS verbal subscore is 5. GCS motor subscore is 6.     Cranial Nerves: No cranial nerve deficit.     Sensory: No sensory deficit.     Coordination: Coordination normal.  Psychiatric:        Speech: Speech normal.        Behavior: Behavior normal.        Thought Content: Thought content normal.      ED Treatments / Results  Labs (all labs ordered are listed, but only abnormal results are displayed) Labs Reviewed  GROUP A STREP BY PCR    EKG None  Radiology No results found.  Procedures Procedures (including critical care time)  Medications Ordered in ED Medications  predniSONE (DELTASONE) tablet 60 mg (has no administration in time range)     Initial Impression / Assessment and Plan / ED Course  I have reviewed the triage vital signs and the nursing notes.  Pertinent labs & imaging results that were available during my care of the patient were reviewed by me and considered in my medical decision making (see chart for details).     Patient presents to the  emergency department for evaluation of upper respiratory infection symptoms.  She has been sick for a week.  Patient has noticed sore throat with tonsillar swelling.  Oropharyngeal examination reveals minimal swelling of the tonsils but there is exudate present.  Strep is negative, however.  She does have cough, nasal and chest congestion as well.  This is consistent with viral etiology.  Final Clinical Impressions(s) / ED Diagnoses   Final diagnoses:  Viral URI with cough    ED Discharge Orders         Ordered    predniSONE (DELTASONE) 20 MG tablet  Daily with breakfast     05/16/18 0112    benzonatate (TESSALON) 200 MG capsule  3 times daily PRN     05/16/18 0112           Gilda CreasePollina, Manveer Gomes J, MD 05/16/18 [id number]

## 2018-05-16 NOTE — ED Notes (Signed)
Pt understood dc material. NAD noted. Script sent in electronically. All questions answered to satisfaction

## 2020-09-18 ENCOUNTER — Emergency Department (INDEPENDENT_AMBULATORY_CARE_PROVIDER_SITE_OTHER): Payer: Worker's Compensation

## 2020-09-18 ENCOUNTER — Other Ambulatory Visit: Payer: Self-pay

## 2020-09-18 ENCOUNTER — Encounter: Payer: Self-pay | Admitting: Emergency Medicine

## 2020-09-18 ENCOUNTER — Emergency Department (INDEPENDENT_AMBULATORY_CARE_PROVIDER_SITE_OTHER)
Admission: EM | Admit: 2020-09-18 | Discharge: 2020-09-18 | Disposition: A | Discharge status: Home / Self Care | Payer: Worker's Compensation | Source: Home / Self Care

## 2020-09-18 DIAGNOSIS — M25512 Pain in left shoulder: Secondary | ICD-10-CM

## 2020-09-18 DIAGNOSIS — S4992XA Unspecified injury of left shoulder and upper arm, initial encounter: Secondary | ICD-10-CM

## 2020-09-18 DIAGNOSIS — M25612 Stiffness of left shoulder, not elsewhere classified: Secondary | ICD-10-CM | POA: Diagnosis not present

## 2020-09-18 MED ORDER — TIZANIDINE HCL 4 MG PO CAPS: 4.0000 mg | ORAL_CAPSULE | Freq: Three times a day (TID) | ORAL | 0 refills | Status: DC

## 2020-09-18 MED ORDER — NAPROXEN 500 MG PO TABS: 500.0000 mg | ORAL_TABLET | Freq: Two times a day (BID) | ORAL | 0 refills | Status: DC

## 2020-09-18 NOTE — ED Provider Notes (Signed)
[facility]    CSN: [id number] Arrival date & time: 09/18/20  1745      History   Chief Complaint Chief Complaint  Patient presents with   Workman's Comp    Left Shoulder Injury    HPI Julia Fields is a 35 y.o. female.   Patient presents today with a several hour history of left shoulder pain following injury.  Patient reports she was using a pallet jack when she hurt her left shoulder and arm.  She has had worsening pain since that time.  Pain is rated 8 on a 0-10 pain scale, localized to lateral left shoulder with radiation into left upper arm, described as aching with periodic sharp pains, worse with movement or palpation, no alleviating factors identified.  She is ambidextrous and uses her left hand for many things but writes with her right hand.  She has not tried any over-the-counter medications for symptom management.  She denies previous injury or surgery.  She does report some paresthesias in left hand but denies any numbness or decreased range of motion of elbow, wrist, hand.   Past Medical History:  Diagnosis Date   Bradycardia    [facility]   Fibromyalgia    Headache    Migraines   Sjogren's disease (HCC)    Vitamin D deficiency     Patient Active Problem List   Diagnosis Date Noted   Slow heart rate 10/28/2016   Palpitations 10/02/2016   Morbid obesity (HCC) 10/02/2016   Fibromyalgia 10/02/2016   Family history of cardiomyopathy 10/02/2016   Sjogren's disease (HCC)     Past Surgical History:  Procedure Laterality Date   DILATATION & CURETTAGE/HYSTEROSCOPY WITH MYOSURE N/A 12/03/2016   Procedure: DILATATION & CURETTAGE/HYSTEROSCOPY WITH MYOSURE;  Surgeon: Maxie Better, MD;  Location: [facility];  Service: Gynecology;  Laterality: N/A;   NECK SURGERY     cyst removed.   TUBAL LIGATION      OB History   No obstetric history on file.      Home Medications    Prior to Admission medications   Medication Sig Start Date End Date  Taking? Authorizing Provider  naproxen (NAPROSYN) 500 MG tablet Take 1 tablet (500 mg total) by mouth 2 (two) times daily. 09/18/20  Yes Zabdi Mis K, PA-C  tiZANidine (ZANAFLEX) 4 MG capsule Take 1 capsule (4 mg total) by mouth 3 (three) times daily. 09/18/20  Yes Hassaan Crite K, PA-C  ibuprofen (ADVIL,MOTRIN) 800 MG tablet Take 1 tablet (800 mg total) by mouth every 8 (eight) hours as needed. 12/03/16   Maxie Better, MD    Family History Family History  Problem Relation Age of Onset   Heart disease Mother    Hypertension Mother    Other Mother    Heart disease Brother    Hypertension Brother    Other Brother     Social History Social History   Tobacco Use   Smoking status: Never   Smokeless tobacco: Never  Vaping Use   Vaping Use: Never used  Substance Use Topics   Alcohol use: Not Currently    Comment: socially   Drug use: No     Allergies   Patient has no known allergies.   Review of Systems Review of Systems  Constitutional:  Positive for activity change. Negative for appetite change, fatigue and fever.  Respiratory:  Negative for cough and shortness of breath.   Cardiovascular:  Negative for chest pain.  Gastrointestinal:  Negative for abdominal  distention, diarrhea, nausea and vomiting.  Musculoskeletal:  Positive for arthralgias and joint swelling. Negative for myalgias.  Skin:  Negative for wound.  Neurological:  Negative for dizziness, weakness, light-headedness, numbness and headaches.    Physical Exam Triage Vital Signs ED Triage Vitals  Enc Vitals Group     BP 09/18/20 1813 116/83     Pulse Rate 09/18/20 1813 66     Resp --      Temp --      Temp src --      SpO2 09/18/20 1813 95 %     Weight --      Height --      Head Circumference --      Peak Flow --      Pain Score 09/18/20 1814 8     Pain Loc --      Pain Edu? --      Excl. in GC? --    No data found.  Updated Vital Signs BP 116/83 (BP Location: Right Arm)   Pulse 66   LMP  09/01/2020   SpO2 95%   Visual Acuity Right Eye Distance:   Left Eye Distance:   Bilateral Distance:    Right Eye Near:   Left Eye Near:    Bilateral Near:     Physical Exam Vitals reviewed.  Constitutional:      General: She is awake. She is not in acute distress.    Appearance: Normal appearance. She is normal weight. She is not ill-appearing.     Comments: Very pleasant female appears stated age in no acute distress sitting comfortably in exam room  HENT:     Head: Normocephalic and atraumatic.  Cardiovascular:     Rate and Rhythm: Normal rate and regular rhythm.     Pulses:          Carotid pulses are 2+ on the right side and 2+ on the left side.    Heart sounds: Normal heart sounds, S1 normal and S2 normal. No murmur heard.    Comments: Unable to assess capillary refill due to acrylic nails.  Normal pulses bilaterally. Pulmonary:     Effort: Pulmonary effort is normal.     Breath sounds: Normal breath sounds. No wheezing, rhonchi or rales.     Comments: Clear to auscultation bilaterally Musculoskeletal:     Left shoulder: Swelling and tenderness present. No deformity or bony tenderness. Decreased range of motion. Decreased strength.     Left hand: Normal strength. Normal sensation. There is no disruption of two-point discrimination.     Comments: Left shoulder: Swelling over humeral head.  No deformity noted.  Decreased range of motion with overhead flexion, internal rotation, external rotation, abduction, extension.  Patient unable to perform Apley scratch, drop arm, empty can due to decreased range of motion and pain.  Hand neurovascularly intact bilaterally.  Psychiatric:        Behavior: Behavior is cooperative.     UC Treatments / Results  Labs (all labs ordered are listed, but only abnormal results are displayed) Labs Reviewed - No data to display  EKG   Radiology DG Shoulder Left  Result Date: 09/18/2020 CLINICAL DATA:  Pain and decreased range of motion.  Left shoulder injury today. EXAM: LEFT SHOULDER - 2+ VIEW COMPARISON:  None. FINDINGS: There is no evidence of fracture or dislocation. There is no evidence of arthropathy or other focal bone abnormality. Soft tissues are unremarkable. IMPRESSION: Negative radiographs of the left shoulder. Electronically Signed  By: Narda Rutherford M.D.   On: 09/18/2020 18:44    Procedures Procedures (including critical care time)  Medications Ordered in UC Medications - No data to display  Initial Impression / Assessment and Plan / UC Course  I have reviewed the triage vital signs and the nursing notes.  Pertinent labs & imaging results that were available during my care of the patient were reviewed by me and considered in my medical decision making (see chart for details).      X-rays obtained's were normal.  Unable to perform complete exam given limited range of motion.  Discussed that we are unable to obtain advanced imaging in clinic and she would need to follow-up with specialist.  She was given contact information for [facility] and instructed to call them first thing in the morning to schedule an appointment.  She was prescribed Naprosyn with instruction not to take NSAIDs with this medication due to risk of GI bleeding.  She was prescribed Zanaflex to be used up to 3 times a day as needed with instruction not to drive or drink alcohol with this medication as drowsiness is a common side effect.  She was excused from work until able to be evaluated by specialist.  Discussed alarm symptoms that warrant emergent evaluation.  Strict return precautions given to which patient expressed understanding.  Final Clinical Impressions(s) / UC Diagnoses   Final diagnoses:  Acute pain of left shoulder  Decreased range of motion of left shoulder  Injury of left shoulder, initial encounter     Discharge Instructions      Your x-ray was normal with no evidence of any bony abnormalities.  We will need to obtain  more advanced imaging such as an MRI to further investigate your symptoms.  This will need to be done through a specialist.  Please contact orthopedics to schedule an appointment as we discussed.  In the meantime, I have called in some medicine to help with your symptoms.  Please take Naprosyn twice daily.  Do not take additional NSAIDs including aspirin, ibuprofen/Advil, naproxen/Aleve with this medication due to risk of GI bleeding.  You can take tizanidine up to 3 times a day as needed.  This will make you sleepy do not drive or drink alcohol with it.  Avoid strenuous activity.  Use heat for additional symptom relief.  If you have any worsening pain, inability to move your left hand, numbness, tingling you need to be reevaluated immediately.     ED Prescriptions     Medication Sig Dispense Auth. Provider   naproxen (NAPROSYN) 500 MG tablet Take 1 tablet (500 mg total) by mouth 2 (two) times daily. 30 tablet Chrystle Murillo K, PA-C   tiZANidine (ZANAFLEX) 4 MG capsule Take 1 capsule (4 mg total) by mouth 3 (three) times daily. 21 capsule Jyaire Koudelka K, PA-C      PDMP not reviewed this encounter.   Jeani Hawking, PA-C 09/18/20 1851

## 2020-09-18 NOTE — ED Triage Notes (Signed)
Patient states that she was injured today at work.  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  Denies any OTC pain meds.

## 2020-09-18 NOTE — Discharge Instructions (Addendum)
Your x-ray was normal with no evidence of any bony abnormalities.  We will need to obtain more advanced imaging such as an MRI to further investigate your symptoms.  This will need to be done through a specialist.  Please contact orthopedics to schedule an appointment as we discussed.  In the meantime, I have called in some medicine to help with your symptoms.  Please take Naprosyn twice daily.  Do not take additional NSAIDs including aspirin, ibuprofen/Advil, naproxen/Aleve with this medication due to risk of GI bleeding.  You can take tizanidine up to 3 times a day as needed.  This will make you sleepy do not drive or drink alcohol with it.  Avoid strenuous activity.  Use heat for additional symptom relief.  If you have any worsening pain, inability to move your left hand, numbness, tingling you need to be reevaluated immediately.

## 2021-05-19 DIAGNOSIS — N9089 Other specified noninflammatory disorders of vulva and perineum: Secondary | ICD-10-CM | POA: Diagnosis not present

## 2021-11-18 ENCOUNTER — Encounter (HOSPITAL_BASED_OUTPATIENT_CLINIC_OR_DEPARTMENT_OTHER): Payer: Self-pay | Admitting: Emergency Medicine

## 2021-11-18 ENCOUNTER — Other Ambulatory Visit: Payer: Self-pay

## 2021-11-18 ENCOUNTER — Emergency Department (HOSPITAL_BASED_OUTPATIENT_CLINIC_OR_DEPARTMENT_OTHER)
Admission: EM | Admit: 2021-11-18 | Discharge: 2021-11-19 | Disposition: A | Discharge status: Home / Self Care | Payer: Worker's Compensation | Attending: Emergency Medicine | Admitting: Emergency Medicine

## 2021-11-18 DIAGNOSIS — W268XXA Contact with other sharp object(s), not elsewhere classified, initial encounter: Secondary | ICD-10-CM | POA: Diagnosis not present

## 2021-11-18 DIAGNOSIS — R200 Anesthesia of skin: Secondary | ICD-10-CM | POA: Insufficient documentation

## 2021-11-18 DIAGNOSIS — R202 Paresthesia of skin: Secondary | ICD-10-CM | POA: Insufficient documentation

## 2021-11-18 DIAGNOSIS — M549 Dorsalgia, unspecified: Secondary | ICD-10-CM | POA: Insufficient documentation

## 2021-11-18 DIAGNOSIS — Y990 Civilian activity done for income or pay: Secondary | ICD-10-CM | POA: Diagnosis not present

## 2021-11-18 DIAGNOSIS — S199XXA Unspecified injury of neck, initial encounter: Secondary | ICD-10-CM | POA: Diagnosis present

## 2021-11-18 DIAGNOSIS — M436 Torticollis: Secondary | ICD-10-CM | POA: Insufficient documentation

## 2021-11-18 DIAGNOSIS — S161XXA Strain of muscle, fascia and tendon at neck level, initial encounter: Secondary | ICD-10-CM | POA: Diagnosis not present

## 2021-11-18 DIAGNOSIS — R519 Headache, unspecified: Secondary | ICD-10-CM | POA: Insufficient documentation

## 2021-11-18 MED ORDER — MORPHINE SULFATE (PF) 4 MG/ML IV SOLN
4.0000 mg | Freq: Once | INTRAVENOUS | Status: AC
  Administered 2021-11-19: 4 mg via INTRAVENOUS
  Filled 2021-11-18: qty 1

## 2021-11-18 MED ORDER — ONDANSETRON HCL 4 MG/2ML IJ SOLN
4.0000 mg | Freq: Once | INTRAMUSCULAR | Status: AC
  Administered 2021-11-19: 4 mg via INTRAVENOUS
  Filled 2021-11-18: qty 2

## 2021-11-18 NOTE — ED Triage Notes (Signed)
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

## 2021-11-18 NOTE — ED Provider Notes (Signed)
[facility] EMERGENCY DEPARTMENT Provider Note   CSN: [id number] Arrival date & time: 11/18/21  1928     History {Add pertinent medical, surgical, social history, OB history to HPI:1} Chief Complaint  Patient presents with   Back Pain    Julia Fields is a 36 y.o. female presenting with sudden onset of severe neck and head pain following a mechanical injury.  States she was at work, went to close the large truck door, slammed it shot, and felt instant pain to her neck radiating down her spine and into her head.  Has never had pain like this before.  Also with some numbness and tingling down her arms.  States she is unable to rotate her head in any direction.  States the pain is so severe that she has been tearful.  Also complaining of "weird pressure sensation in my head" since the incident, that feels different than her migraines.  Denies vision loss, disequilibrium, hearing changes, abdominal pain, N/V, saddle anesthesia, or leg weakness.  No Hx of orthopedic neck or shoulder injuries or surgery.  Hx of fibromyalgia, Sjogren's disease, migraines, bradycardia, obesity, and family history of cardiomyopathy.  The history is provided by the patient and medical records.  Back Pain    Home Medications Prior to Admission medications   Medication Sig Start Date End Date Taking? Authorizing Provider  ibuprofen (ADVIL,MOTRIN) 800 MG tablet Take 1 tablet (800 mg total) by mouth every 8 (eight) hours as needed. 12/03/16   Maxie Better, MD  naproxen (NAPROSYN) 500 MG tablet Take 1 tablet (500 mg total) by mouth 2 (two) times daily. 09/18/20   Raspet, Noberto Retort, PA-C  tiZANidine (ZANAFLEX) 4 MG capsule Take 1 capsule (4 mg total) by mouth 3 (three) times daily. 09/18/20   Raspet, Noberto Retort, PA-C      Allergies    Patient has no known allergies.    Review of Systems   Review of Systems  Musculoskeletal:  Positive for back pain.   Physical Exam Updated Vital Signs BP 130/88 (BP  Location: Left Arm)   Pulse (!) 58   Temp 98 F (36.7 C) (Oral)   Resp 16   Ht 5\' 1"  (1.549 m)   Wt 87.1 kg   LMP 11/17/2021 (Exact Date)   SpO2 100%   BMI 36.28 kg/m  Physical Exam Vitals and nursing note reviewed.  Constitutional:      General: She is not in acute distress.    Appearance: She is well-developed. She is not ill-appearing, toxic-appearing or diaphoretic.     Comments: Appears uncomfortable  HENT:     Head: Normocephalic and atraumatic.  Eyes:     Extraocular Movements: Extraocular movements intact.     Conjunctiva/sclera: Conjunctivae normal.     Pupils: Pupils are equal, round, and reactive to light.  Neck:     Comments: Significant neck tenderness and limited ROM due to elicited tenderness. Cardiovascular:     Rate and Rhythm: Normal rate and regular rhythm.     Pulses: Normal pulses.     Heart sounds: Normal heart sounds. No murmur heard. Pulmonary:     Effort: Pulmonary effort is normal. No respiratory distress.     Breath sounds: Normal breath sounds.  Abdominal:     Palpations: Abdomen is soft.     Tenderness: There is no abdominal tenderness.  Musculoskeletal:        General: Tenderness present. No swelling.     Cervical back: Rigidity and tenderness present.  Comments: Significant midline and paraspinal muscle TTP of cervical spine out of proportion on exam.  Lymphadenopathy:     Cervical: No cervical adenopathy.  Skin:    General: Skin is warm and dry.     Capillary Refill: Capillary refill takes less than 2 seconds.     Coloration: Skin is not jaundiced or pale.  Neurological:     Mental Status: She is alert and oriented to person, place, and time.     Sensory: Sensory deficit (Subjective numbness and tingling of upper extremities) present.     Motor: No weakness (Strength grossly intact of all extremities).     Coordination: Coordination normal.  Psychiatric:        Mood and Affect: Mood normal.     ED Results / Procedures /  Treatments   Labs (all labs ordered are listed, but only abnormal results are displayed) Labs Reviewed  BASIC METABOLIC PANEL  CBC   EKG None  Radiology No results found.  Procedures Procedures  {Document cardiac monitor, telemetry assessment procedure when appropriate:1}  Medications Ordered in ED Medications  morphine (PF) 4 MG/ML injection 4 mg (has no administration in time range)  ondansetron (ZOFRAN) injection 4 mg (has no administration in time range)    ED Course/ Medical Decision Making/ A&P                           Medical Decision Making  36 y.o. female presents to the ED for concern of Back Pain     This involves an extensive number of treatment options, and is a complaint that carries with it a high risk of complications and morbidity.  The emergent differential diagnosis prior to evaluation includes, but is not limited to: cervical strain, torticollis, carotid dissection  This is not an exhaustive differential.   Past Medical History / Co-morbidities / Social History: Hx of fibromyalgia, sjogrens, bradycardia, morbid obesity Social Determinants of Health include: None  Additional History:  Obtained by chart review.  Notably ***  Lab Tests: I ordered, and personally interpreted labs.  The pertinent results include:   CMP:  CBC:  Pt deferred pregnancy testing, stating she is currently on her menses and does not wish to be tested.  Pt understands the risks of deferring this and wishes to proceed.  Imaging Studies: I ordered imaging studies including CTA head and neck.   I independently visualized and interpreted imaging which showed *** I agree with the radiologist interpretation.  ED Course / Critical Interventions: Pt well-appearing on exam.  ***  Upon reevaluation, *** I have reviewed the patients home medicines and have made adjustments as needed.  Disposition: Considered admission and after reviewing the patient's encounter today, I feel  that the patient would benefit from ***.  Discussed course of treatment with the patient, whom demonstrated understanding.  Patient in agreement and has no further questions.    I discussed this case with my attending, Dr. Adela Lank, who agreed with the proposed treatment course and cosigned this note including patient's presenting symptoms, physical exam, and planned diagnostics and interventions.  Attending physician stated agreement with plan or made changes to plan which were implemented.     This chart was dictated using voice recognition software.  Despite best efforts to proofread, errors can occur which can change the documentation meaning.   {Document critical care time when appropriate:1} {Document review of labs and clinical decision tools ie heart score, Chads2Vasc2 etc:1}  {Document your  independent review of radiology images, and any outside records:1} {Document your discussion with family members, caretakers, and with consultants:1} {Document social determinants of health affecting pt's care:1} {Document your decision making why or why not admission, treatments were needed:1} Final Clinical Impression(s) / ED Diagnoses Final diagnoses:  None    Rx / DC Orders ED Discharge Orders     None

## 2021-11-19 ENCOUNTER — Emergency Department (HOSPITAL_BASED_OUTPATIENT_CLINIC_OR_DEPARTMENT_OTHER): Payer: Self-pay

## 2021-11-19 LAB — CBC
HCT: 37.4 % (ref 36.0–46.0)
Hemoglobin: 12 g/dL (ref 12.0–15.0)
MCH: 25.6 pg — ABNORMAL LOW (ref 26.0–34.0)
MCHC: 32.1 g/dL (ref 30.0–36.0)
MCV: 79.7 fL — ABNORMAL LOW (ref 80.0–100.0)
Platelets: 295 10*3/uL (ref 150–400)
RBC: 4.69 MIL/uL (ref 3.87–5.11)
RDW: 14 % (ref 11.5–15.5)
WBC: 9.1 10*3/uL (ref 4.0–10.5)
nRBC: 0 % (ref 0.0–0.2)

## 2021-11-19 LAB — BASIC METABOLIC PANEL
Anion gap: 6 (ref 5–15)
BUN: 19 mg/dL (ref 6–20)
CO2: 25 mmol/L (ref 22–32)
Calcium: 8.8 mg/dL — ABNORMAL LOW (ref 8.9–10.3)
Chloride: 110 mmol/L (ref 98–111)
Creatinine, Ser: 0.83 mg/dL (ref 0.44–1.00)
GFR, Estimated: >60 mL/min (ref >60)
Glucose, Bld: 86 mg/dL (ref 70–99)
Potassium: 3.7 mmol/L (ref 3.5–5.1)
Sodium: 141 mmol/L (ref 135–145)

## 2021-11-19 MED ORDER — NAPROXEN 500 MG PO TABS: 500.0000 mg | ORAL_TABLET | Freq: Two times a day (BID) | ORAL | 0 refills | Status: DC

## 2021-11-19 MED ORDER — IOHEXOL 350 MG/ML SOLN
75.0000 mL | Freq: Once | INTRAVENOUS | Status: AC | PRN
  Administered 2021-11-19: 75 mL via INTRAVENOUS

## 2021-11-19 MED ORDER — METHOCARBAMOL 500 MG PO TABS: 500.0000 mg | ORAL_TABLET | Freq: Two times a day (BID) | ORAL | 0 refills | Status: AC | PRN

## 2021-11-19 MED ORDER — METHOCARBAMOL 500 MG PO TABS
500.0000 mg | ORAL_TABLET | Freq: Once | ORAL | Status: AC
  Administered 2021-11-19: 500 mg via ORAL
  Filled 2021-11-19: qty 1

## 2021-11-19 NOTE — Discharge Instructions (Signed)
Please follow-up with your orthopedic specialist within the next 2 to 3 days for reevaluation continue medical management.  You have been prescribed an anti-inflammatory by the name of naproxen.  You may take 1 tablet every 12 hours as needed for pain relief.  Always take with plenty of food and water.  Do not take ibuprofen or Motrin on top of this, stay on same medication family.  Though you may take Tylenol in addition to the naproxen.  You have also been prescribed a prescription for Robaxin, a muscle relaxant.  You may take 1 tablet before bed each night for the next few days for additional relief.  You may take this as often as once every 12 hours, but no more than that.  Do not drive or operate machinery after using this medication.  If the drowsiness is more than you feel you can handle, you may stop take this medication at any time.  Return to the ED for any new or worsening symptoms as discussed.

## 2021-11-19 NOTE — ED Notes (Signed)
Patient taken to CT.

## 2022-10-15 DIAGNOSIS — N926 Irregular menstruation, unspecified: Secondary | ICD-10-CM | POA: Diagnosis not present

## 2022-10-15 DIAGNOSIS — Z124 Encounter for screening for malignant neoplasm of cervix: Secondary | ICD-10-CM | POA: Diagnosis not present

## 2022-10-15 DIAGNOSIS — Z01419 Encounter for gynecological examination (general) (routine) without abnormal findings: Secondary | ICD-10-CM | POA: Diagnosis not present

## 2022-10-15 DIAGNOSIS — R1030 Lower abdominal pain, unspecified: Secondary | ICD-10-CM | POA: Diagnosis not present

## 2022-10-15 DIAGNOSIS — R102 Pelvic and perineal pain: Secondary | ICD-10-CM | POA: Diagnosis not present

## 2022-10-15 DIAGNOSIS — Z1151 Encounter for screening for human papillomavirus (HPV): Secondary | ICD-10-CM | POA: Diagnosis not present

## 2022-11-06 DIAGNOSIS — R102 Pelvic and perineal pain: Secondary | ICD-10-CM | POA: Diagnosis not present

## 2022-11-06 DIAGNOSIS — N921 Excessive and frequent menstruation with irregular cycle: Secondary | ICD-10-CM | POA: Diagnosis not present

## 2022-12-04 DIAGNOSIS — R102 Pelvic and perineal pain: Secondary | ICD-10-CM | POA: Diagnosis not present

## 2022-12-04 DIAGNOSIS — N921 Excessive and frequent menstruation with irregular cycle: Secondary | ICD-10-CM | POA: Diagnosis not present

## 2022-12-04 DIAGNOSIS — Z3202 Encounter for pregnancy test, result negative: Secondary | ICD-10-CM | POA: Diagnosis not present

## 2022-12-04 DIAGNOSIS — N910 Primary amenorrhea: Secondary | ICD-10-CM | POA: Diagnosis not present

## 2023-02-06 ENCOUNTER — Ambulatory Visit
Admission: EM | Admit: 2023-02-06 | Discharge: 2023-02-06 | Disposition: A | Discharge status: Home / Self Care | Payer: BC Managed Care – PPO | Attending: Family Medicine | Admitting: Family Medicine

## 2023-02-06 ENCOUNTER — Other Ambulatory Visit: Payer: Self-pay

## 2023-02-06 ENCOUNTER — Encounter: Payer: Self-pay | Admitting: Emergency Medicine

## 2023-02-06 DIAGNOSIS — R55 Syncope and collapse: Secondary | ICD-10-CM | POA: Diagnosis not present

## 2023-02-06 DIAGNOSIS — R001 Bradycardia, unspecified: Secondary | ICD-10-CM | POA: Diagnosis not present

## 2023-02-06 LAB — POCT FASTING CBG KUC MANUAL ENTRY: POCT Glucose (KUC): 86 mg/dL (ref 70–99)

## 2023-02-06 NOTE — ED Triage Notes (Signed)
Patient states that she started feeling lightheaded and dizzy yesterday around 4pm while at work.  On her way to the bathroom, she passed out and when she fell to the floor, she immediately woke up.  Another person who saw her gave her some orange juice and had patient sit down and patient felt fine the remainder of the day.  Patient did not go to the ED for any evaluation.  Recently had labs drawn x 1 month ago, all labs were normal.  Yesterday food log:  Breakfast: Fruit smoothie w/protein @ 10am Lunch: Applesauce, cutie oranage @ 1pm Snack: 2 boilded eggs @3pm ;

## 2023-02-06 NOTE — Discharge Instructions (Signed)
Increase calories to 1200/day minimum

## 2023-02-06 NOTE — ED Provider Notes (Signed)
[facility]    CSN: [id number] Arrival date & time: 02/06/23  1122      History   Chief Complaint Chief Complaint  Patient presents with   Dizziness    HPI Julia Fields is a 37 y.o. female.   HPI  This is a very pleasant 37 year old woman who is here for an episode yesterday.  She states that she is on a very calorie restricted diet because she is going on a cruise in December, and is trying to lose weight quickly.  She gave a list of her food eaten yesterday and it was somewhere under 700 cal for the day.  Later in the afternoon she had a sharp pain in her mid epigastrium.  She states that her vision felt dull, she thought she might faint, she broke into a sweat, and then fell to the floor.  She states that she immediately woke up and was given some orange juice and then felt better. 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  She has never had any kind of dizzy spell or fainting in the past. 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  She has been seen by cardiology.  She has not required any treatment for her bradycardia.  She states she does sometimes feel that she has spells of rapid heartbeat.  She drinks very limited caffeine  Past Medical History:  Diagnosis Date   Bradycardia    [facility]   Fibromyalgia    Headache    Migraines   Sjogren's disease (HCC)    Vitamin D deficiency     Patient Active Problem List   Diagnosis Date Noted   Slow heart rate 10/28/2016   Palpitations 10/02/2016   Morbid obesity (HCC) 10/02/2016   Fibromyalgia 10/02/2016   Family history of cardiomyopathy 10/02/2016   Sjogren's disease (HCC)     Past Surgical History:  Procedure Laterality Date   DILATATION & CURETTAGE/HYSTEROSCOPY WITH MYOSURE N/A 12/03/2016   Procedure: DILATATION & CURETTAGE/HYSTEROSCOPY WITH MYOSURE;  Surgeon: Maxie Better, MD;  Location: [facility];  Service: Gynecology;  Laterality: N/A;   NECK SURGERY      cyst removed.   TUBAL LIGATION      OB History   No obstetric history on file.      Home Medications    Prior to Admission medications   Medication Sig Start Date End Date Taking? Authorizing Provider  Aluminum & Magnesium Hydroxide (MAGNESIUM-ALUMINUM PO) Take by mouth.   Yes [provider]    Family History Family History  Problem Relation Age of Onset   Heart disease Mother    Hypertension Mother    Other Mother    Heart disease Brother    Hypertension Brother    Other Brother     Social History Social History   Tobacco Use   Smoking status: Never   Smokeless tobacco: Never  Vaping Use   Vaping status: Never Used  Substance Use Topics   Alcohol use: Not Currently    Comment: socially   Drug use: No     Allergies   Patient has no known allergies.   Review of Systems Review of Systems See HPI  Physical Exam Triage Vital Signs ED Triage Vitals  Encounter Vitals Group     BP 02/06/23 1210 120/79     Systolic BP Percentile --      Diastolic BP Percentile --      Pulse Rate 02/06/23 1210 Marland Kitchen)  50     Resp 02/06/23 1210 18     Temp 02/06/23 1210 98.3 F (36.8 C)     Temp Source 02/06/23 1210 Oral     SpO2 02/06/23 1210 100 %     Weight --      Height --      Head Circumference --      Peak Flow --      Pain Score 02/06/23 1213 0     Pain Loc --      Pain Education --      Exclude from Growth Chart --    No data found.  Updated Vital Signs BP 120/79 (BP Location: Left Arm)   Pulse (!) 50   Temp 98.3 F (36.8 C) (Oral)   Resp 18   LMP 01/09/2023 (Exact Date)   SpO2 100%       Physical Exam Constitutional:      General: She is not in acute distress.    Appearance: She is well-developed. She is obese.     Comments: BMI 36+  HENT:     Head: Normocephalic and atraumatic.     Right Ear: Tympanic membrane and ear canal normal.     Left Ear: Tympanic membrane and ear canal normal.     Nose: Nose normal. No congestion.      Mouth/Throat:     Mouth: Mucous membranes are moist.     Pharynx: No posterior oropharyngeal erythema.  Eyes:     Extraocular Movements: Extraocular movements intact.     Conjunctiva/sclera: Conjunctivae normal.     Pupils: Pupils are equal, round, and reactive to light.     Comments: Discs benign  Neck:     Vascular: No carotid bruit.  Cardiovascular:     Rate and Rhythm: Regular rhythm. Bradycardia present.     Heart sounds: Normal heart sounds.  Pulmonary:     Effort: Pulmonary effort is normal. No respiratory distress.     Breath sounds: Normal breath sounds.  Abdominal:     General: Abdomen is flat. There is no distension.     Palpations: Abdomen is soft.     Tenderness: There is no abdominal tenderness.  Musculoskeletal:        General: Normal range of motion.     Cervical back: Normal range of motion and neck supple.     Right lower leg: No edema.  Skin:    General: Skin is warm and dry.  Neurological:     General: No focal deficit present.     Mental Status: She is alert.     Deep Tendon Reflexes: Reflexes normal.  Psychiatric:        Mood and Affect: Mood normal.        Behavior: Behavior normal.      UC Treatments / Results  Labs (all labs ordered are listed, but only abnormal results are displayed) Labs Reviewed  POCT FASTING [facility] MANUAL ENTRY    EKG   Radiology No results found.  Procedures ED EKG  Date/Time: 02/06/2023 12:39 PM  Performed by: Eustace Moore, MD Authorized by: Eustace Moore, MD   ECG interpreted by ED Physician in the absence of a cardiologist: yes   Previous ECG:    Previous ECG:  Unavailable Interpretation:    Interpretation: abnormal     Details:  Bradycardia, low voltage Quality:    Tracing quality:  Limited by artifact Rate:    ECG rate:  45   ECG rate  assessment: bradycardic   Rhythm:    Rhythm: sinus rhythm   Ectopy:    Ectopy: none   QRS:    QRS axis:  Normal   QRS intervals:  Normal   QRS  conduction: normal   ST segments:    ST segments:  Normal T waves:    T waves: normal   Q waves:    Abnormal Q-waves: not present   Correction - compared to 2018.  Bradycardia present , voltage lower  Medications Ordered in UC Medications - No data to display  Initial Impression / Assessment and Plan / UC Course  I have reviewed the triage vital signs and the nursing notes.  Pertinent labs & imaging results that were available during my care of the patient were reviewed by me and considered in my medical decision making (see chart for details).     Discussed that it is entirely likely that she had a hypoglycemic spell given her low oral intake for the day.I explained the multifactorial differential diagnosis for syncope including vascular, neurologic, metabolic, psychiatric.  Ideally additional workup will be done.  Patient has a PCP that she can see. Final Clinical Impressions(s) / UC Diagnoses   Final diagnoses:  Vasovagal syncope  Bradycardia     Discharge Instructions      Increase calories to 1200/day minimum     ED Prescriptions   None    PDMP not reviewed this encounter.   Eustace Moore, MD 02/06/23 [phone]

## 2023-06-23 ENCOUNTER — Encounter (HOSPITAL_BASED_OUTPATIENT_CLINIC_OR_DEPARTMENT_OTHER): Payer: Self-pay

## 2023-06-23 ENCOUNTER — Emergency Department (HOSPITAL_BASED_OUTPATIENT_CLINIC_OR_DEPARTMENT_OTHER)

## 2023-06-23 ENCOUNTER — Emergency Department (HOSPITAL_BASED_OUTPATIENT_CLINIC_OR_DEPARTMENT_OTHER)
Admission: EM | Admit: 2023-06-23 | Discharge: 2023-06-23 | Disposition: A | Discharge status: Home / Self Care | Attending: Emergency Medicine | Admitting: Emergency Medicine

## 2023-06-23 DIAGNOSIS — R1013 Epigastric pain: Secondary | ICD-10-CM | POA: Diagnosis present

## 2023-06-23 DIAGNOSIS — R110 Nausea: Secondary | ICD-10-CM | POA: Insufficient documentation

## 2023-06-23 LAB — HCG, SERUM, QUALITATIVE: Preg, Serum: NEGATIVE

## 2023-06-23 LAB — CBC WITH DIFFERENTIAL/PLATELET
Abs Immature Granulocytes: 0.04 10*3/uL (ref 0.00–0.07)
Basophils Absolute: 0 10*3/uL (ref 0.0–0.1)
Basophils Relative: 0 %
Eosinophils Absolute: 0.2 10*3/uL (ref 0.0–0.5)
Eosinophils Relative: 2 %
HCT: 36.6 % (ref 36.0–46.0)
Hemoglobin: 12.2 g/dL (ref 12.0–15.0)
Immature Granulocytes: 0 %
Lymphocytes Relative: 11 %
Lymphs Abs: 1.3 10*3/uL (ref 0.7–4.0)
MCH: 25.9 pg — ABNORMAL LOW (ref 26.0–34.0)
MCHC: 33.3 g/dL (ref 30.0–36.0)
MCV: 77.7 fL — ABNORMAL LOW (ref 80.0–100.0)
Monocytes Absolute: 0.4 10*3/uL (ref 0.1–1.0)
Monocytes Relative: 4 %
Neutro Abs: 9.3 10*3/uL — ABNORMAL HIGH (ref 1.7–7.7)
Neutrophils Relative %: 83 %
Platelets: 204 10*3/uL (ref 150–400)
RBC: 4.71 MIL/uL (ref 3.87–5.11)
RDW: 14.3 % (ref 11.5–15.5)
WBC: 11.2 10*3/uL — ABNORMAL HIGH (ref 4.0–10.5)
nRBC: 0 % (ref 0.0–0.2)

## 2023-06-23 LAB — COMPREHENSIVE METABOLIC PANEL
ALT: 59 U/L — ABNORMAL HIGH (ref 0–44)
AST: 111 U/L — ABNORMAL HIGH (ref 15–41)
Albumin: 3.4 g/dL — ABNORMAL LOW (ref 3.5–5.0)
Alkaline Phosphatase: 71 U/L (ref 38–126)
Anion gap: 12 (ref 5–15)
BUN: 12 mg/dL (ref 6–20)
CO2: 17 mmol/L — ABNORMAL LOW (ref 22–32)
Calcium: 8.5 mg/dL — ABNORMAL LOW (ref 8.9–10.3)
Chloride: 107 mmol/L (ref 98–111)
Creatinine, Ser: 0.63 mg/dL (ref 0.44–1.00)
GFR, Estimated: >60 mL/min (ref >60)
Glucose, Bld: 140 mg/dL — ABNORMAL HIGH (ref 70–99)
Potassium: 3.3 mmol/L — ABNORMAL LOW (ref 3.5–5.1)
Sodium: 136 mmol/L (ref 135–145)
Total Bilirubin: 0.6 mg/dL (ref 0.0–1.2)
Total Protein: 7.1 g/dL (ref 6.5–8.1)

## 2023-06-23 LAB — LIPASE, BLOOD: Lipase: 30 U/L (ref 11–51)

## 2023-06-23 MED ORDER — HYDROMORPHONE HCL 1 MG/ML IJ SOLN
1.0000 mg | Freq: Once | INTRAMUSCULAR | Status: AC
  Administered 2023-06-23: 1 mg via INTRAVENOUS
  Filled 2023-06-23: qty 1

## 2023-06-23 MED ORDER — HYOSCYAMINE SULFATE 0.125 MG SL SUBL
0.2500 mg | SUBLINGUAL_TABLET | Freq: Once | SUBLINGUAL | Status: AC
  Administered 2023-06-23: 0.25 mg via SUBLINGUAL
  Filled 2023-06-23: qty 2

## 2023-06-23 MED ORDER — ONDANSETRON 4 MG PO TBDP: 4.0000 mg | ORAL_TABLET | Freq: Three times a day (TID) | ORAL | 0 refills | Status: AC | PRN

## 2023-06-23 MED ORDER — ONDANSETRON HCL 4 MG/2ML IJ SOLN
4.0000 mg | Freq: Once | INTRAMUSCULAR | Status: AC
Start: 2023-06-23 — End: 2023-06-23
  Administered 2023-06-23: 4 mg via INTRAVENOUS
  Filled 2023-06-23: qty 2

## 2023-06-23 MED ORDER — PANTOPRAZOLE SODIUM 40 MG IV SOLR
40.0000 mg | Freq: Once | INTRAVENOUS | Status: AC
  Administered 2023-06-23: 40 mg via INTRAVENOUS
  Filled 2023-06-23: qty 10

## 2023-06-23 MED ORDER — OMEPRAZOLE 20 MG PO CPDR: 20.0000 mg | DELAYED_RELEASE_CAPSULE | Freq: Every day | ORAL | 0 refills | Status: AC

## 2023-06-23 MED ORDER — IOHEXOL 300 MG/ML  SOLN
100.0000 mL | Freq: Once | INTRAMUSCULAR | Status: AC | PRN
  Administered 2023-06-23: 100 mL via INTRAVENOUS

## 2023-06-23 MED ORDER — ONDANSETRON HCL 4 MG/2ML IJ SOLN
4.0000 mg | Freq: Once | INTRAMUSCULAR | Status: AC
  Administered 2023-06-23: 4 mg via INTRAVENOUS
  Filled 2023-06-23: qty 2

## 2023-06-23 NOTE — ED Notes (Signed)
 Pt able to tolerate PO challenge, EDP notified

## 2023-06-23 NOTE — ED Provider Notes (Signed)
 Patient signed out to me by previous provider. Please refer to their note for full HPI.  Briefly this is a 38 year old female who presented to the emergency department with epigastric pain that started this morning.  Associated with nausea.  Blood work shows mild transaminitis, AST greater than ALT.  Initial ultrasound of the gallbladder was unremarkable with the degree of discomfort the patient was a CT was ordered.  Currently she is feeling better with IV medicine.  CT of the abdomen pelvis does not identify any acute process.  Question if this is probably a gastritis, may be alcohol related.  Patient was able to p.o. without difficulty after another round of medications.  Discussed with the patient symptomatic treatment and possible GI follow-up if symptoms do not resolve or recur.  Discussed stomach irritation and ways and foods to avoid.  Patient at this time appears safe and stable for discharge and close outpatient follow up. Discharge plan and strict return to ED precautions discussed, patient verbalizes understanding and agreement.   Rozelle Logan, DO 06/23/23 1138

## 2023-06-23 NOTE — ED Notes (Signed)
 Small amt of ice chips given  Per EDP

## 2023-06-23 NOTE — Discharge Instructions (Addendum)
 You have been seen and discharged from the emergency department.  Your blood work showed mildly elevated liver function test but otherwise was normal.  The ultrasound of your gallbladder was normal and the CAT scan did not identify any acute finding.  I believe you are suffering from stomach irritation or gastritis.  You were given IV medicine here.  I have sent a prescription for medicine to help with acid production and nausea.  If symptoms persist or recur you should follow-up with gastroenterology for more specialized testing.  Follow-up with your primary provider for further evaluation and further care. Take home medications as prescribed. If you have any worsening symptoms or further concerns for your health please return to an emergency department for further evaluation.

## 2023-06-23 NOTE — ED Triage Notes (Signed)
 Pt c/o epigastric pain, started this am prior to husband returning from work.  1 episode of diarrhea no vomiting.   +nausea Pt hollering in pain upon arrival.  States she has never had this pain before.   Diaphoretic

## 2023-06-23 NOTE — ED Notes (Signed)
 Pt given oatmeal and ginger ale per RN.

## 2023-06-23 NOTE — ED Provider Notes (Signed)
 [place] [facility] Provider Note   CSN: [id number] Arrival date & time: 06/23/23  5784     History {Add pertinent medical, surgical, social history, OB history to HPI:1} Chief Complaint  Patient presents with   Abdominal Pain    Julia Fields is a 38 y.o. female.  Presents to the emergency department for evaluation of abdominal pain.  Patient reports severe upper central abdominal pain associated with nausea.       Home Medications Prior to Admission medications   Medication Sig Start Date End Date Taking? Authorizing Provider  Aluminum & Magnesium Hydroxide (MAGNESIUM-ALUMINUM PO) Take by mouth.    [provider]      Allergies    Patient has no known allergies.    Review of Systems   Review of Systems  Physical Exam Updated Vital Signs Pulse 69   Temp (!) 96 F (35.6 C) (Temporal)   Resp 18   SpO2 100%  Physical Exam Vitals and nursing note reviewed.  Constitutional:      General: She is in acute distress.     Appearance: She is well-developed.  HENT:     Head: Normocephalic and atraumatic.     Mouth/Throat:     Mouth: Mucous membranes are moist.  Eyes:     General: Vision grossly intact. Gaze aligned appropriately.     Extraocular Movements: Extraocular movements intact.     Conjunctiva/sclera: Conjunctivae normal.  Cardiovascular:     Rate and Rhythm: Normal rate and regular rhythm.     Pulses: Normal pulses.     Heart sounds: Normal heart sounds, S1 normal and S2 normal. No murmur heard.    No friction rub. No gallop.  Pulmonary:     Effort: Pulmonary effort is normal. No respiratory distress.     Breath sounds: Normal breath sounds.  Abdominal:     General: Bowel sounds are normal.     Palpations: Abdomen is soft.     Tenderness: There is abdominal tenderness in the epigastric area. There is no guarding or rebound.     Hernia: No hernia is present.  Musculoskeletal:        General: No  swelling.     Cervical back: Full passive range of motion without pain, normal range of motion and neck supple. No spinous process tenderness or muscular tenderness. Normal range of motion.     Right lower leg: No edema.     Left lower leg: No edema.  Skin:    General: Skin is warm and dry.     Capillary Refill: Capillary refill takes less than 2 seconds.     Findings: No ecchymosis, erythema, rash or wound.  Neurological:     General: No focal deficit present.     Mental Status: She is alert and oriented to person, place, and time.     GCS: GCS eye subscore is 4. GCS verbal subscore is 5. GCS motor subscore is 6.     Cranial Nerves: Cranial nerves 2-12 are intact.     Sensory: Sensation is intact.     Motor: Motor function is intact.     Coordination: Coordination is intact.  Psychiatric:        Attention and Perception: Attention normal.        Mood and Affect: Mood normal.        Speech: Speech normal.        Behavior: Behavior normal.     ED Results / Procedures /  Treatments   Labs (all labs ordered are listed, but only abnormal results are displayed) Labs Reviewed - No data to display  EKG None  Radiology No results found.  Procedures Procedures  {Document cardiac monitor, telemetry assessment procedure when appropriate:1}  Medications Ordered in ED Medications  ondansetron (ZOFRAN) injection 4 mg (has no administration in time range)  HYDROmorphone (DILAUDID) injection 1 mg (has no administration in time range)    ED Course/ Medical Decision Making/ A&P   {   Click here for ABCD2, HEART and other calculatorsREFRESH Note before signing :1}                              Medical Decision Making Amount and/or Complexity of Data Reviewed Labs: ordered.  Risk Prescription drug management.   ***  {Document critical care time when appropriate:1} {Document review of labs and clinical decision tools ie heart score, Chads2Vasc2 etc:1}  {Document your  independent review of radiology images, and any outside records:1} {Document your discussion with family members, caretakers, and with consultants:1} {Document social determinants of health affecting pt's care:1} {Document your decision making why or why not admission, treatments were needed:1} Final Clinical Impression(s) / ED Diagnoses Final diagnoses:  None    Rx / DC Orders ED Discharge Orders     None

## 2023-06-23 NOTE — ED Notes (Signed)
 [place] tech at bedside Pt c/o pain Edp made aware

## 2023-06-23 NOTE — ED Notes (Signed)
 Started PO challenge per EDP request
